# Patient Record
Sex: Female | Born: 1985 | Race: Black or African American | Hispanic: No | Marital: Single | Smoking: Former smoker
Health system: Southern US, Community
[De-identification: ages and names within clinical notes are randomized; demographics above are authoritative.]

## PROBLEM LIST (undated history)

## (undated) ENCOUNTER — Inpatient Hospital Stay (HOSPITAL_COMMUNITY): Payer: Self-pay

## (undated) DIAGNOSIS — D649 Anemia, unspecified: Secondary | ICD-10-CM

## (undated) HISTORY — PX: TONSILLECTOMY: SUR1361

---

## 2012-07-13 ENCOUNTER — Emergency Department (HOSPITAL_COMMUNITY)
Admission: EM | Admit: 2012-07-13 | Discharge: 2012-07-13 | Disposition: A | Discharge status: Home Health | Payer: Medicaid Other | Attending: Emergency Medicine | Admitting: Emergency Medicine

## 2012-07-13 ENCOUNTER — Encounter (HOSPITAL_COMMUNITY): Payer: Self-pay | Admitting: *Deleted

## 2012-07-13 DIAGNOSIS — N390 Urinary tract infection, site not specified: Secondary | ICD-10-CM

## 2012-07-13 DIAGNOSIS — N898 Other specified noninflammatory disorders of vagina: Secondary | ICD-10-CM

## 2012-07-13 DIAGNOSIS — M545 Low back pain, unspecified: Secondary | ICD-10-CM | POA: Insufficient documentation

## 2012-07-13 DIAGNOSIS — Z331 Pregnant state, incidental: Secondary | ICD-10-CM

## 2012-07-13 DIAGNOSIS — R109 Unspecified abdominal pain: Secondary | ICD-10-CM | POA: Insufficient documentation

## 2012-07-13 LAB — URINALYSIS, ROUTINE W REFLEX MICROSCOPIC
Bilirubin Urine: NEGATIVE
Glucose, UA: NEGATIVE mg/dL
Hgb urine dipstick: NEGATIVE
Ketones, ur: NEGATIVE mg/dL
Protein, ur: NEGATIVE mg/dL
Urobilinogen, UA: 0.2 mg/dL (ref 0.0–1.0)

## 2012-07-13 LAB — CBC WITH DIFFERENTIAL/PLATELET
Basophils Absolute: 0 10*3/uL (ref 0.0–0.1)
Basophils Relative: 0 % (ref 0–1)
Eosinophils Absolute: 0 10*3/uL (ref 0.0–0.7)
Eosinophils Relative: 1 % (ref 0–5)
HCT: 31.7 % — ABNORMAL LOW (ref 36.0–46.0)
Lymphocytes Relative: 25 % (ref 12–46)
MCH: 29.1 pg (ref 26.0–34.0)
MCHC: 36.3 g/dL — ABNORMAL HIGH (ref 30.0–36.0)
MCV: 80.3 fL (ref 78.0–100.0)
Monocytes Absolute: 0.4 10*3/uL (ref 0.1–1.0)
Platelets: 210 10*3/uL (ref 150–400)
RDW: 13.7 % (ref 11.5–15.5)

## 2012-07-13 LAB — COMPREHENSIVE METABOLIC PANEL
AST: 16 U/L (ref 0–37)
CO2: 25 mEq/L (ref 19–32)
Calcium: 9.5 mg/dL (ref 8.4–10.5)
Creatinine, Ser: 0.57 mg/dL (ref 0.50–1.10)
GFR calc non Af Amer: >90 mL/min (ref >90)
Sodium: 133 mEq/L — ABNORMAL LOW (ref 135–145)
Total Protein: 6.7 g/dL (ref 6.0–8.3)

## 2012-07-13 LAB — URINE MICROSCOPIC-ADD ON

## 2012-07-13 LAB — POCT PREGNANCY, URINE: Preg Test, Ur: POSITIVE — AB

## 2012-07-13 LAB — WET PREP, GENITAL
Clue Cells Wet Prep HPF POC: NONE SEEN
Yeast Wet Prep HPF POC: NONE SEEN

## 2012-07-13 MED ORDER — CEPHALEXIN 500 MG PO CAPS: 500.0000 mg | ORAL_CAPSULE | Freq: Four times a day (QID) | ORAL | Status: DC

## 2012-07-13 NOTE — ED Notes (Signed)
Pelvic cart set up in room 

## 2012-07-13 NOTE — ED Notes (Signed)
Pt sts LMP was in June 2013

## 2012-07-13 NOTE — ED Provider Notes (Signed)
History     CSN: [id number]  Arrival date & time 07/13/12  6578   First MD Initiated Contact with Patient 07/13/12 1055      Chief Complaint  Patient presents with  . Abdominal Pain  . Vaginal Bleeding    (Consider location/radiation/quality/duration/timing/severity/associated sxs/prior treatment) HPI Comments: 27 year old female who presents with abdominal cramping and lower back cramping with associated vaginal spotting for the last 2 days. She reports an increased vaginal discharge. Her symptoms are intermittent, mild, nothing seems to make it better or worse, not associated with fevers chills nausea or vomiting. She states that she has had one child in the past who is currently approximately 26 years old, she has no other pregnancies, she has irregular periods and does not use birth control other than barrier method with the occasional condom use. She is unsure she is pregnant.  Patient is a 27 y.o. female presenting with abdominal pain and vaginal bleeding. The history is provided by the patient.  Abdominal Pain Associated symptoms: vaginal bleeding   Associated symptoms: no chest pain, no chills, no cough, no diarrhea, no dysuria, no fever, no nausea, no shortness of breath, no sore throat and no vomiting   Vaginal Bleeding Associated symptoms include abdominal pain. Pertinent negatives include no chest pain, no headaches and no shortness of breath.    History reviewed. No pertinent past medical history.  History reviewed. No pertinent past surgical history.  No family history on file.  History  Substance Use Topics  . Smoking status: Never Smoker   . Smokeless tobacco: Not on file  . Alcohol Use: No    OB History   Grav Para Term Preterm Abortions TAB SAB Ect Mult Living                  Review of Systems  Constitutional: Negative for fever and chills.  HENT: Negative for sore throat and neck pain.   Eyes: Negative for visual disturbance.  Respiratory: Negative  for cough and shortness of breath.   Cardiovascular: Negative for chest pain.  Gastrointestinal: Positive for abdominal pain. Negative for nausea, vomiting and diarrhea.  Genitourinary: Positive for vaginal bleeding. Negative for dysuria and frequency.  Musculoskeletal: Negative for back pain.  Skin: Negative for rash.  Neurological: Negative for weakness, numbness and headaches.  Hematological: Negative for adenopathy.  Psychiatric/Behavioral: Negative for behavioral problems.    Allergies  Review of patient's allergies indicates no known allergies.  Home Medications   Current Outpatient Rx  Name  Route  Sig  Dispense  Refill  . cephALEXin (KEFLEX) 500 MG capsule   Oral   Take 1 capsule (500 mg total) by mouth 4 (four) times daily.   28 capsule   0     BP 139/57  Pulse 67  Temp(Src) 98 F (36.7 C) (Oral)  Resp 18  SpO2 100%  Physical Exam  Nursing note and vitals reviewed. Constitutional: She appears well-developed and well-nourished. No distress.  HENT:  Head: Normocephalic and atraumatic.  Mouth/Throat: Oropharynx is clear and moist. No oropharyngeal exudate.  Eyes: Conjunctivae and EOM are normal. Pupils are equal, round, and reactive to light. Right eye exhibits no discharge. Left eye exhibits no discharge. No scleral icterus.  Neck: Normal range of motion. Neck supple. No JVD present. No thyromegaly present.  Cardiovascular: Normal rate, regular rhythm, normal heart sounds and intact distal pulses.  Exam reveals no gallop and no friction rub.   No murmur heard. Pulmonary/Chest: Effort normal and breath sounds normal.  No respiratory distress. She has no wheezes. She has no rales.  Abdominal: Soft. Bowel sounds are normal. She exhibits no distension and no mass. There is tenderness.  The abdomen is soft and minimally tender in the lower abdomen centrally and bilaterally. No upper abdominal tenderness, no peritoneal signs, no focal tenderness at McBurney's point   Genitourinary:  Chaperone present for exam, the patient has some yellow vaginal discharge, no cervical motion tenderness or adnexal tenderness or fullness, no vaginal bleeding, cervical os is closed  Musculoskeletal: Normal range of motion. She exhibits no edema and no tenderness.  Lymphadenopathy:    She has no cervical adenopathy.  Neurological: She is alert. Coordination normal.  Skin: Skin is warm and dry. No rash noted. No erythema.  Psychiatric: She has a normal mood and affect. Her behavior is normal.    ED Course  Procedures (including critical care time)  Labs Reviewed  WET PREP, GENITAL - Abnormal; Notable for the following:    WBC, Wet Prep HPF POC MODERATE (*)    All other components within normal limits  URINALYSIS, ROUTINE W REFLEX MICROSCOPIC - Abnormal; Notable for the following:    APPearance CLOUDY (*)    Leukocytes, UA LARGE (*)    All other components within normal limits  CBC WITH DIFFERENTIAL - Abnormal; Notable for the following:    Hemoglobin 11.5 (*)    HCT 31.7 (*)    MCHC 36.3 (*)    All other components within normal limits  COMPREHENSIVE METABOLIC PANEL - Abnormal; Notable for the following:    Sodium 133 (*)    Albumin 3.3 (*)    All other components within normal limits  URINE MICROSCOPIC-ADD ON - Abnormal; Notable for the following:    Squamous Epithelial / LPF MANY (*)    Bacteria, UA FEW (*)    All other components within normal limits  POCT PREGNANCY, URINE - Abnormal; Notable for the following:    Preg Test, Ur POSITIVE (*)    All other components within normal limits  URINE CULTURE  GC/CHLAMYDIA PROBE AMP  LIPASE, BLOOD  ABO/RH   No results found.   1. Pregnant state, incidental   2. Vaginal discharge   3. UTI (lower urinary tract infection)       MDM   I have performed a bedside ultrasound showing an intrauterine pregnancy, fetus with a heart rate of 150 beats per minute, the patient is approaching or likely into the early  second trimester. She is not have any significant abdominal pain or hypotension to suggest that she has an ectopic pregnancy. Will evaluate her for infection such as gonorrhea Chlamydia Trichomonas and yeast, urinalysis pending, urine pregnancy positive.  Pt stable for d/c.  O+, Wet prep neg for infecticon - no ttp with pelvic exam, pt informed that testing will be back within 48 hours.  Na slightly low, blood counts normal, will treat for UTI, culture urine and have f/u with OBGYN      Vida Roller, MD 07/14/12 [phone]

## 2012-07-13 NOTE — ED Notes (Signed)
Pt reports abdominal cramping and vaginal bleeding x 2 days. Reports nausea, no vomiting.

## 2012-07-13 NOTE — ED Notes (Signed)
Pt waiting for test results, talking on her cellphone

## 2012-07-14 LAB — GC/CHLAMYDIA PROBE AMP
CT Probe RNA: NEGATIVE
GC Probe RNA: NEGATIVE

## 2012-07-15 LAB — URINE CULTURE: Colony Count: >=100000

## 2012-08-22 ENCOUNTER — Encounter: Payer: Self-pay | Admitting: Obstetrics

## 2012-08-24 ENCOUNTER — Emergency Department (HOSPITAL_COMMUNITY)
Admission: EM | Admit: 2012-08-24 | Discharge: 2012-08-24 | Disposition: A | Discharge status: Home / Self Care | Payer: Medicaid Other | Attending: Emergency Medicine | Admitting: Emergency Medicine

## 2012-08-24 ENCOUNTER — Emergency Department (HOSPITAL_COMMUNITY): Payer: Medicaid Other

## 2012-08-24 ENCOUNTER — Encounter (HOSPITAL_COMMUNITY): Payer: Self-pay | Admitting: Emergency Medicine

## 2012-08-24 DIAGNOSIS — O9989 Other specified diseases and conditions complicating pregnancy, childbirth and the puerperium: Secondary | ICD-10-CM | POA: Insufficient documentation

## 2012-08-24 DIAGNOSIS — Z3490 Encounter for supervision of normal pregnancy, unspecified, unspecified trimester: Secondary | ICD-10-CM

## 2012-08-24 DIAGNOSIS — R1031 Right lower quadrant pain: Secondary | ICD-10-CM | POA: Insufficient documentation

## 2012-08-24 DIAGNOSIS — M549 Dorsalgia, unspecified: Secondary | ICD-10-CM | POA: Insufficient documentation

## 2012-08-24 LAB — COMPREHENSIVE METABOLIC PANEL
Alkaline Phosphatase: 55 U/L (ref 39–117)
BUN: 7 mg/dL (ref 6–23)
Calcium: 9.2 mg/dL (ref 8.4–10.5)
Creatinine, Ser: 0.55 mg/dL (ref 0.50–1.10)
GFR calc Af Amer: >90 mL/min (ref >90)
Glucose, Bld: 79 mg/dL (ref 70–99)
Total Protein: 6.8 g/dL (ref 6.0–8.3)

## 2012-08-24 LAB — POCT PREGNANCY, URINE: Preg Test, Ur: POSITIVE — AB

## 2012-08-24 LAB — URINALYSIS, MICROSCOPIC ONLY
Ketones, ur: NEGATIVE mg/dL
Nitrite: NEGATIVE
Specific Gravity, Urine: 1.023 (ref 1.005–1.030)
pH: 6 (ref 5.0–8.0)

## 2012-08-24 LAB — CBC WITH DIFFERENTIAL/PLATELET
Eosinophils Absolute: 0.1 10*3/uL (ref 0.0–0.7)
Eosinophils Relative: 1 % (ref 0–5)
Hemoglobin: 11.3 g/dL — ABNORMAL LOW (ref 12.0–15.0)
Lymphs Abs: 2.5 10*3/uL (ref 0.7–4.0)
MCH: 29.5 pg (ref 26.0–34.0)
MCV: 82.5 fL (ref 78.0–100.0)
Monocytes Relative: 6 % (ref 3–12)
RBC: 3.83 MIL/uL — ABNORMAL LOW (ref 3.87–5.11)

## 2012-08-24 MED ORDER — PRENATAL COMPLETE 14-0.4 MG PO TABS: 1.0000 | ORAL_TABLET | Freq: Every day | ORAL | Status: DC

## 2012-08-24 NOTE — ED Notes (Signed)
Pt dc to home. Pt states understanding to dc instructions. Pt ambulatory to exit without difficulty. 

## 2012-08-24 NOTE — ED Provider Notes (Signed)
History     CSN: [id number]  Arrival date & time 08/24/12  1746   First MD Initiated Contact with Patient 08/24/12 1940      Chief Complaint  Patient presents with  . Abdominal Pain     Patient is a 27 y.o. female presenting with abdominal pain. The history is provided by the patient.  Abdominal Pain Pain location:  RLQ Pain quality: cramping   Pain radiates to:  Does not radiate Pain severity:  Mild Onset quality:  Gradual Duration:  2 days Timing:  Intermittent Progression:  Worsening Chronicity:  New Relieved by:  Nothing Worsened by:  Nothing tried Associated symptoms: no chills, no dysuria, no fever, no hematuria, no nausea, no vaginal bleeding, no vaginal discharge and no vomiting   Risk factors: pregnancy     PMH - none  History reviewed. No pertinent past surgical history.  History reviewed. No pertinent family history.  History  Substance Use Topics  . Smoking status: Never Smoker   . Smokeless tobacco: Not on file  . Alcohol Use: No    OB History   Grav Para Term Preterm Abortions TAB SAB Ect Mult Living                  Review of Systems  Constitutional: Negative for fever and chills.  Gastrointestinal: Positive for abdominal pain. Negative for nausea and vomiting.  Genitourinary: Negative for dysuria, hematuria, vaginal bleeding and vaginal discharge.  Musculoskeletal: Positive for back pain.    Allergies  Review of patient's allergies indicates no known allergies.  Home Medications   Current Outpatient Rx  Name  Route  Sig  Dispense  Refill  . acetaminophen (TYLENOL) 325 MG tablet   Oral   Take 650 mg by mouth every 6 (six) hours as needed for pain.           BP 115/66  Pulse 68  Temp(Src) 98.4 F (36.9 C) (Oral)  Resp 16  SpO2 99%  Physical Exam CONSTITUTIONAL: Well developed/well nourished HEAD: Normocephalic/atraumatic EYES: EOMI/PERRL ENMT: Mucous membranes moist NECK: supple no meningeal signs SPINE:entire spine  nontender CV: S1/S2 noted, no murmurs/rubs/gallops noted LUNGS: Lungs are clear to auscultation bilaterally, no apparent distress ABDOMEN: soft, nontender, no rebound or guarding GU:no cva tenderness NEURO: Pt is awake/alert, moves all extremitiesx4 EXTREMITIES: pulses normal, full ROM SKIN: warm, color normal PSYCH: no abnormalities of mood noted  ED Course  Procedures   Labs Reviewed  CBC WITH DIFFERENTIAL - Abnormal; Notable for the following:    WBC 11.5 (*)    RBC 3.83 (*)    Hemoglobin 11.3 (*)    HCT 31.6 (*)    Neutro Abs 8.3 (*)    All other components within normal limits  COMPREHENSIVE METABOLIC PANEL - Abnormal; Notable for the following:    Sodium 134 (*)    Albumin 3.2 (*)    Total Bilirubin 0.2 (*)    All other components within normal limits  URINALYSIS, MICROSCOPIC ONLY - Abnormal; Notable for the following:    APPearance CLOUDY (*)    Leukocytes, UA MODERATE (*)    Bacteria, UA FEW (*)    Squamous Epithelial / LPF MANY (*)    All other components within normal limits  HCG, QUANTITATIVE, PREGNANCY - Abnormal; Notable for the following:    hCG, Beta Chain, Quant, S 3015 (*)    All other components within normal limits  POCT PREGNANCY, URINE - Abnormal; Notable for the following:    Preg Test,  Ur POSITIVE (*)    All other components within normal limits  URINE CULTURE   8:44 PM Pt presents for abdominal cramping for 2 days.  She also reports back cramps She is in no distress and her abdominal exam is benign She refuses pelvic exam She was seen in 07/13/12 for abd pain and found to be pregnant.  Bedside US showed likely first trimester pregnancy Pt is unsure of her exact dates as her menstrual cycle has been irregular.  She has not seen OB as of yet She is clinically stable but I do feel formal pelvic ultrasound would help further delineate age of pregnancy She reports her OB appt is next month  9:58 PM Pt well appearing, no distress, watching  TV Discussed [place] results and need for close followup She denies any trauma, or any other concerns    MDM  Nursing notes including past medical history and social history reviewed and considered in documentation Labs/vital reviewed and considered Previous records reviewed and considered - recent ED visit reviewed         Joya Gaskins, MD 08/24/12 2159

## 2012-08-24 NOTE — ED Notes (Signed)
Pt to ultrasound

## 2012-08-24 NOTE — ED Notes (Addendum)
Pt c/o abd cramping and back pain; pt seen here in March and found out pregnant; pt unsure of LMP; pt denies bleeding and thinks she is 3-4 months; pt reports no prenatal care and G2 P1

## 2012-08-26 LAB — URINE CULTURE: Colony Count: >=100000

## 2012-09-18 ENCOUNTER — Ambulatory Visit (INDEPENDENT_AMBULATORY_CARE_PROVIDER_SITE_OTHER): Payer: Medicaid Other | Admitting: Obstetrics & Gynecology

## 2012-09-18 ENCOUNTER — Encounter: Payer: Self-pay | Admitting: Obstetrics & Gynecology

## 2012-09-18 VITALS — BP 119/80 | Temp 98.4°F | Wt 222.0 lb

## 2012-09-18 DIAGNOSIS — A599 Trichomoniasis, unspecified: Secondary | ICD-10-CM

## 2012-09-18 DIAGNOSIS — Z98891 History of uterine scar from previous surgery: Secondary | ICD-10-CM

## 2012-09-18 DIAGNOSIS — Z9889 Other specified postprocedural states: Secondary | ICD-10-CM

## 2012-09-18 DIAGNOSIS — Z3480 Encounter for supervision of other normal pregnancy, unspecified trimester: Secondary | ICD-10-CM

## 2012-09-18 DIAGNOSIS — Z3482 Encounter for supervision of other normal pregnancy, second trimester: Secondary | ICD-10-CM

## 2012-09-18 LAB — POCT WET PREP (WET MOUNT)

## 2012-09-18 MED ORDER — METRONIDAZOLE 500 MG PO TABS: 500.0000 mg | ORAL_TABLET | Freq: Two times a day (BID) | ORAL | Status: DC

## 2012-09-18 NOTE — Patient Instructions (Addendum)
Trichomoniasis Trichomoniasis is an infection, caused by the Trichomonas organism, that affects both women and men. In women, the outer female genitalia and the vagina are affected. In men, the penis is mainly affected, but the prostate and other reproductive organs can also be involved. Trichomoniasis is a sexually transmitted disease (STD) and is most often passed to another person through sexual contact. The majority of people who get trichomoniasis do so from a sexual encounter and are also at risk for other STDs. CAUSES   Sexual intercourse with an infected partner.  It can be present in swimming pools or hot tubs. SYMPTOMS   Abnormal gray-green frothy vaginal discharge in women.  Vaginal itching and irritation in women.  Itching and irritation of the area outside the vagina in women.  Penile discharge with or without pain in males.  Inflammation of the urethra (urethritis), causing painful urination.  Bleeding after sexual intercourse. RELATED COMPLICATIONS  Pelvic inflammatory disease.  Infection of the uterus (endometritis).  Infertility.  Tubal (ectopic) pregnancy.  It can be associated with other STDs, including gonorrhea and chlamydia, hepatitis B, and HIV. COMPLICATIONS DURING PREGNANCY  Early (premature) delivery.  Premature rupture of the membranes (PROM).  Low birth weight. DIAGNOSIS   Visualization of Trichomonas under the microscope from the vagina discharge.  Ph of the vagina greater than 4.5, tested with a test tape.  Trich Rapid Test.  Culture of the organism, but this is not usually needed.  It may be found on a Pap test.  Having a "strawberry cervix,"which means the cervix looks very red like a strawberry. TREATMENT   You may be given medication to fight the infection. Inform your caregiver if you could be or are pregnant. Some medications used to treat the infection should not be taken during pregnancy.  Over-the-counter medications or  creams to decrease itching or irritation may be recommended.  Your sexual partner will need to be treated if infected. HOME CARE INSTRUCTIONS   Take all medication prescribed by your caregiver.  Take over-the-counter medication for itching or irritation as directed by your caregiver.  Do not have sexual intercourse while you have the infection.  Do not douche or wear tampons.  Discuss your infection with your partner, as your partner may have acquired the infection from you. Or, your partner may have been the person who transmitted the infection to you.  Have your sex partner examined and treated if necessary.  Practice safe, informed, and protected sex.  See your caregiver for other STD testing. SEEK MEDICAL CARE IF:   You still have symptoms after you finish the medication.  You have an oral temperature above 102 F (38.9 C).  You develop belly (abdominal) pain.  You have pain when you urinate.  You have bleeding after sexual intercourse.  You develop a rash.  The medication makes you sick or makes you throw up (vomit). Document Released: 10/20/2000 Document Revised: 07/19/2011 Document Reviewed: 11/15/2008 [facility] Patient Information 2013 [place], [place]. Glucose Tolerance Test This is a test to see how your body processes carbohydrates. This test is often done to check patients for diabetes or the possibility of developing it. PREPARATION FOR TEST You should have nothing to eat or drink 12 hours before the test. You will be given a form of sugar (glucose) and then blood samples will be drawn from your vein to determine the level of sugar in your blood. Alternatively, blood may be drawn from your finger for testing. You should not smoke or exercise during  the test. NORMAL FINDINGS  Fasting: 70-115 mg/dL  30 minutes: less than 200 mg/dL  1 hour: less than 161 mg/dL  2 hours: less than 096 mg/dL  3 hours: 04-540 mg/dL  4 hours: 98-119 mg/dL Ranges for normal  findings may vary among different laboratories and hospitals. You should always check with your doctor after having lab work or other tests done to discuss the meaning of your test results and whether your values are considered within normal limits. MEANING OF TEST Your caregiver will go over the test results with you and discuss the importance and meaning of your results, as well as treatment options and the need for additional tests. OBTAINING THE TEST RESULTS It is your responsibility to obtain your test results. Ask the lab or department performing the test when and how you will get your results. Document Released: 05/19/2004 Document Revised: 07/19/2011 Document Reviewed: 04/06/2008 [facility] Patient Information 2013 [place], [place].

## 2012-09-18 NOTE — Addendum Note (Signed)
Addended by: George Hugh on: 09/18/2012 05:06 PM   Modules accepted: Orders

## 2012-09-18 NOTE — Addendum Note (Signed)
Addended by: George Hugh on: 09/18/2012 05:00 PM   Modules accepted: Orders

## 2012-09-18 NOTE — Progress Notes (Signed)
P 71 Patient reports she has some pressure in the rectal area. Pt also states she has some itching with her discharge. Subjective:    Robin Cochran is being seen today for her first obstetrical visit.  This is not a planned pregnancy. She is at [redacted]w[redacted]d gestation. Her obstetrical history is significant for late prenatal care. Relationship with FOB: significant other, living together. Patient does not intend to breast feed. Pregnancy history fully reviewed.  Menstrual History: OB History   Grav Para Term Preterm Abortions TAB SAB Ect Mult Living   2 1 1       1        No LMP recorded. Patient is pregnant.    The following portions of the patient's history were reviewed and updated as appropriate: allergies, current medications, past family history, past medical history, past social history, past surgical history and problem list.  Review of Systems Pertinent items are noted in HPI.    Objective:   General Appearance:    Alert, cooperative, no distress, appears stated age  Head:    Normocephalic, without obvious abnormality, atraumatic  Eyes:    PERRL, conjunctiva/corneas clear, EOM's intact, fundi    benign, both eyes  Ears:    Normal TM's and external ear canals, both ears  Nose:   Nares normal, septum midline, mucosa normal, no drainage    or sinus tenderness  Throat:   Lips, mucosa, and tongue normal; teeth and gums normal  Neck:   Supple, symmetrical, trachea midline, no adenopathy;    thyroid:  no enlargement/tenderness/nodules; no carotid   bruit or JVD  Back:     Symmetric, no curvature, ROM normal, no CVA tenderness  Lungs:     Clear to auscultation bilaterally, respirations unlabored  Chest Wall:    No tenderness or deformity   Heart:    Regular rate and rhythm, S1 and S2 normal, no murmur, rub   or gallop  Breast Exam:    No tenderness, masses, or nipple abnormality  Abdomen:     Soft, non-tender, bowel sounds active all four quadrants,    no masses, no organomegaly   Genitalia:    Normal female without lesion; white discharge   Extremities:   Extremities normal, atraumatic, no cyanosis or edema  Pulses:   2+ and symmetric all extremities  Skin:   Skin color, texture, turgor normal, no rashes or lesions  Lymph nodes:   Cervical, supraclavicular, and axillary nodes normal  Neurologic:   CNII-XII intact, normal strength, sensation and reflexes    throughout     Assessment:    Pregnancy at [redacted]w[redacted]d weeks    Plan:    Initial labs drawn. Prenatal vitamins. Problem list reviewed and updated.  Role of ultrasound in pregnancy discussed; fetal survey: requested. Amniocentesis discussed: not indicated. Follow up in 4 weeks. 50% of 20 min visit spent on counseling and coordination of care.

## 2012-09-19 ENCOUNTER — Other Ambulatory Visit: Payer: Self-pay | Admitting: Obstetrics & Gynecology

## 2012-09-19 DIAGNOSIS — Z1389 Encounter for screening for other disorder: Secondary | ICD-10-CM

## 2012-09-19 LAB — PAP IG W/ RFLX HPV ASCU

## 2012-09-19 LAB — HIV ANTIBODY (ROUTINE TESTING W REFLEX): HIV: NONREACTIVE

## 2012-09-19 LAB — WET PREP BY MOLECULAR PROBE: Trichomonas vaginosis: NEGATIVE

## 2012-09-20 ENCOUNTER — Ambulatory Visit (INDEPENDENT_AMBULATORY_CARE_PROVIDER_SITE_OTHER): Payer: Medicaid Other

## 2012-09-20 ENCOUNTER — Encounter: Payer: Self-pay | Admitting: Obstetrics & Gynecology

## 2012-09-20 DIAGNOSIS — Z369 Encounter for antenatal screening, unspecified: Secondary | ICD-10-CM

## 2012-09-20 DIAGNOSIS — Z1389 Encounter for screening for other disorder: Secondary | ICD-10-CM

## 2012-09-20 LAB — OBSTETRIC PANEL
Antibody Screen: NEGATIVE
Basophils Absolute: 0 10*3/uL (ref 0.0–0.1)
Basophils Relative: 0 % (ref 0–1)
Eosinophils Absolute: 0.1 10*3/uL (ref 0.0–0.7)
Eosinophils Relative: 0 % (ref 0–5)
MCH: 28.9 pg (ref 26.0–34.0)
MCHC: 34 g/dL (ref 30.0–36.0)
MCV: 85.1 fL (ref 78.0–100.0)
Platelets: 217 10*3/uL (ref 150–400)
RDW: 15.3 % (ref 11.5–15.5)
WBC: 11.2 10*3/uL — ABNORMAL HIGH (ref 4.0–10.5)

## 2012-09-20 LAB — US OB DETAIL + 14 WK

## 2012-09-20 LAB — HEMOGLOBINOPATHY EVALUATION
Hgb A: 97.3 % (ref 96.8–97.8)
Hgb F Quant: 0 % (ref 0.0–2.0)
Hgb S Quant: 0 %

## 2012-09-21 ENCOUNTER — Encounter: Payer: Self-pay | Admitting: Obstetrics & Gynecology

## 2012-09-22 ENCOUNTER — Encounter: Payer: Self-pay | Admitting: Obstetrics & Gynecology

## 2012-09-22 DIAGNOSIS — R8271 Bacteriuria: Secondary | ICD-10-CM | POA: Insufficient documentation

## 2012-09-22 DIAGNOSIS — E559 Vitamin D deficiency, unspecified: Secondary | ICD-10-CM | POA: Insufficient documentation

## 2012-09-22 DIAGNOSIS — O9989 Other specified diseases and conditions complicating pregnancy, childbirth and the puerperium: Secondary | ICD-10-CM

## 2012-09-22 DIAGNOSIS — Z22330 Carrier of Group B streptococcus: Secondary | ICD-10-CM | POA: Insufficient documentation

## 2012-10-10 ENCOUNTER — Other Ambulatory Visit: Payer: Self-pay | Admitting: *Deleted

## 2012-10-10 ENCOUNTER — Encounter: Payer: Self-pay | Admitting: *Deleted

## 2012-10-10 DIAGNOSIS — N390 Urinary tract infection, site not specified: Secondary | ICD-10-CM

## 2012-10-10 DIAGNOSIS — E559 Vitamin D deficiency, unspecified: Secondary | ICD-10-CM

## 2012-10-10 MED ORDER — OB COMPLETE PETITE 35-5-1-200 MG PO CAPS: 1.0000 | ORAL_CAPSULE | Freq: Every day | ORAL | Status: DC

## 2012-10-10 MED ORDER — SULFAMETHOXAZOLE-TMP DS 800-160 MG PO TABS: 1.0000 | ORAL_TABLET | Freq: Two times a day (BID) | ORAL | Status: DC

## 2012-10-18 ENCOUNTER — Encounter: Payer: Medicaid Other | Admitting: Obstetrics & Gynecology

## 2012-10-23 ENCOUNTER — Encounter: Payer: Medicaid Other | Admitting: Obstetrics & Gynecology

## 2012-11-23 ENCOUNTER — Encounter: Payer: Medicaid Other | Admitting: Obstetrics & Gynecology

## 2012-12-25 ENCOUNTER — Encounter: Payer: Medicaid Other | Admitting: Obstetrics

## 2013-07-24 ENCOUNTER — Encounter (HOSPITAL_COMMUNITY): Payer: Self-pay | Admitting: *Deleted

## 2014-01-09 ENCOUNTER — Ambulatory Visit: Payer: Medicaid Other | Admitting: Internal Medicine

## 2014-03-11 ENCOUNTER — Encounter (HOSPITAL_COMMUNITY): Payer: Self-pay | Admitting: *Deleted

## 2014-03-11 ENCOUNTER — Ambulatory Visit: Payer: Medicaid Other | Admitting: Obstetrics & Gynecology

## 2014-05-06 ENCOUNTER — Encounter: Payer: Self-pay | Admitting: *Deleted

## 2014-05-07 ENCOUNTER — Encounter: Payer: Self-pay | Admitting: Obstetrics & Gynecology

## 2014-09-17 ENCOUNTER — Emergency Department (HOSPITAL_COMMUNITY)
Admission: EM | Admit: 2014-09-17 | Discharge: 2014-09-17 | Disposition: A | Discharge status: Home / Self Care | Payer: Medicaid Other | Attending: Emergency Medicine | Admitting: Emergency Medicine

## 2014-09-17 ENCOUNTER — Encounter (HOSPITAL_COMMUNITY): Payer: Self-pay | Admitting: Physical Medicine and Rehabilitation

## 2014-09-17 DIAGNOSIS — K0889 Other specified disorders of teeth and supporting structures: Secondary | ICD-10-CM

## 2014-09-17 DIAGNOSIS — Z87891 Personal history of nicotine dependence: Secondary | ICD-10-CM | POA: Diagnosis not present

## 2014-09-17 DIAGNOSIS — K088 Other specified disorders of teeth and supporting structures: Secondary | ICD-10-CM | POA: Diagnosis present

## 2014-09-17 DIAGNOSIS — K029 Dental caries, unspecified: Secondary | ICD-10-CM | POA: Insufficient documentation

## 2014-09-17 DIAGNOSIS — K0381 Cracked tooth: Secondary | ICD-10-CM | POA: Insufficient documentation

## 2014-09-17 DIAGNOSIS — Z792 Long term (current) use of antibiotics: Secondary | ICD-10-CM | POA: Diagnosis not present

## 2014-09-17 MED ORDER — NAPROXEN 500 MG PO TABS: 500.0000 mg | ORAL_TABLET | Freq: Two times a day (BID) | ORAL | Status: DC

## 2014-09-17 MED ORDER — NAPROXEN 250 MG PO TABS
500.0000 mg | ORAL_TABLET | Freq: Once | ORAL | Status: AC
  Administered 2014-09-17: 500 mg via ORAL
  Filled 2014-09-17: qty 2

## 2014-09-17 MED ORDER — PENICILLIN V POTASSIUM 250 MG PO TABS
500.0000 mg | ORAL_TABLET | Freq: Once | ORAL | Status: AC
  Administered 2014-09-17: 500 mg via ORAL
  Filled 2014-09-17: qty 2

## 2014-09-17 MED ORDER — PENICILLIN V POTASSIUM 500 MG PO TABS: 500.0000 mg | ORAL_TABLET | Freq: Four times a day (QID) | ORAL | Status: DC

## 2014-09-17 NOTE — ED Provider Notes (Signed)
CSN: [id number]     Arrival date & time 09/17/14  1352 History  This chart was scribed for Emilia BeckKaitlyn Dary Dilauro, PA-C working with Bethann BerkshireJoseph Zammit, MD by Evon Slackerrance Branch, ED Scribe. This patient was seen in room TR05C/TR05C and the patient's care was started at 2:16 PM.      Chief Complaint  Patient presents with  . Dental Pain   Patient is a 29 y.o. female presenting with tooth pain. The history is provided by the patient. No language interpreter was used.  Dental Pain Associated symptoms: no fever    HPI Comments: Lucrezia StarchKimberly Ferrington is a 29 y.o. female who presents to the Emergency Department complaining of right sided upper dental pain onset 2 months prior. Pt rates the severity of her pan 8/10. Pt states the pain recently worsened 2 days ago when she chipped her tooth eating pepperoni. Pt doesn't report any medications PTA. Pt denies fever, trouble swallowing or other related symptoms.   History reviewed. No pertinent past medical history. Past Surgical History  Procedure Laterality Date  . Cesarean section    . Tonsillectomy     Family History  Problem Relation Age of Onset  . Arthritis Mother   . Hypertension Father   . Hypertension Paternal Grandmother    History  Substance Use Topics  . Smoking status: Former Smoker    Quit date: 05/11/2011  . Smokeless tobacco: Not on file  . Alcohol Use: Yes   OB History    Gravida Para Term Preterm AB TAB SAB Ectopic Multiple Living   2 1 1       1       Review of Systems  Constitutional: Negative for fever.  HENT: Positive for dental problem. Negative for trouble swallowing.   All other systems reviewed and are negative.    Allergies  Review of patient's allergies indicates no known allergies.  Home Medications   Prior to Admission medications   Medication Sig Start Date End Date Taking? Authorizing Provider  acetaminophen (TYLENOL) 325 MG tablet Take 650 mg by mouth every 6 (six) hours as needed for pain.    Historical  Provider, MD  metroNIDAZOLE (FLAGYL) 500 MG tablet Take 1 tablet (500 mg total) by mouth 2 (two) times daily. 09/18/12   Antionette CharLisa Jackson-Moore, MD  Prenat-FeCbn-FeAspGl-FA-Omega (OB COMPLETE PETITE) 35-5-1-200 MG CAPS Take 1 capsule by mouth daily. 10/10/12   Antionette CharLisa Jackson-Moore, MD  sulfamethoxazole-trimethoprim (BACTRIM DS) 800-160 MG per tablet Take 1 tablet by mouth 2 (two) times daily. 10/10/12   Antionette CharLisa Jackson-Moore, MD   BP 129/82 mmHg  Pulse 63  Temp(Src) 98 F (36.7 C) (Oral)  Resp 20  SpO2 99%   Physical Exam  Constitutional: She is oriented to person, place, and time. She appears well-developed and well-nourished. No distress.  HENT:  Head: Normocephalic and atraumatic.  Mouth/Throat: No dental abscesses.  right upper pre molar cracked and decayed and tender to percussion. No dental abscess noted.   Eyes: Conjunctivae and EOM are normal.  Neck: Neck supple. No tracheal deviation present.  Cardiovascular: Normal rate.   Pulmonary/Chest: Effort normal. No respiratory distress.  Abdominal: Soft. She exhibits no distension. There is no tenderness. There is no rebound.  Musculoskeletal: Normal range of motion.  Neurological: She is alert and oriented to person, place, and time.  Skin: Skin is warm and dry.  Psychiatric: She has a normal mood and affect. Her behavior is normal.  Nursing note and vitals reviewed.   ED Course  Procedures (including critical care  time) DIAGNOSTIC STUDIES: Oxygen Saturation is 99% on RA, normal by my interpretation.    COORDINATION OF CARE: 3:17 PM-Discussed treatment plan with pt at bedside and pt agreed to plan.     Labs Review Labs Reviewed - No data to display  Imaging Review No results found.   EKG Interpretation None      MDM   Final diagnoses:  Pain, dental    Patient will have Veetid and naprosyn for dental pain. No signs of ludwigs angina. Vitals stable and patient afebrile. Patient given dental follow up.   I personally  performed the services described in this documentation, which was scribed in my presence. The recorded information has been reviewed and is accurate.      Emilia BeckKaitlyn Kela Baccari, PA-C 09/18/14 1108  Bethann BerkshireJoseph Zammit, MD 09/18/14 1452

## 2014-09-17 NOTE — ED Notes (Signed)
Pt presents to department for evaluation of R sided upper dental pain. Ongoing x1 week. 8/10 pain upon arrival to ED.

## 2014-09-17 NOTE — Discharge Instructions (Signed)
Take Veetid as directed until gone. Take Naprosyn as needed for pain. Follow up with the dentist. Return to the ED with worsening or concerning symptoms.

## 2014-11-20 ENCOUNTER — Emergency Department (HOSPITAL_COMMUNITY)
Admission: EM | Admit: 2014-11-20 | Discharge: 2014-11-20 | Disposition: A | Discharge status: Home / Self Care | Payer: Medicaid Other | Attending: Emergency Medicine | Admitting: Emergency Medicine

## 2014-11-20 ENCOUNTER — Encounter (HOSPITAL_COMMUNITY): Payer: Self-pay | Admitting: *Deleted

## 2014-11-20 DIAGNOSIS — Z87891 Personal history of nicotine dependence: Secondary | ICD-10-CM | POA: Insufficient documentation

## 2014-11-20 DIAGNOSIS — Z79899 Other long term (current) drug therapy: Secondary | ICD-10-CM | POA: Insufficient documentation

## 2014-11-20 DIAGNOSIS — K029 Dental caries, unspecified: Secondary | ICD-10-CM | POA: Insufficient documentation

## 2014-11-20 DIAGNOSIS — K0381 Cracked tooth: Secondary | ICD-10-CM | POA: Insufficient documentation

## 2014-11-20 DIAGNOSIS — S025XXA Fracture of tooth (traumatic), initial encounter for closed fracture: Secondary | ICD-10-CM

## 2014-11-20 MED ORDER — BUPIVACAINE-EPINEPHRINE (PF) 0.5% -1:200000 IJ SOLN
1.8000 mL | Freq: Once | INTRAMUSCULAR | Status: AC
  Administered 2014-11-20: 1.8 mL
  Filled 2014-11-20: qty 1.8

## 2014-11-20 MED ORDER — PENICILLIN V POTASSIUM 500 MG PO TABS: 500.0000 mg | ORAL_TABLET | Freq: Three times a day (TID) | ORAL | Status: DC

## 2014-11-20 MED ORDER — NAPROXEN 500 MG PO TABS: 500.0000 mg | ORAL_TABLET | Freq: Two times a day (BID) | ORAL | Status: DC

## 2014-11-20 NOTE — ED Provider Notes (Signed)
CSN: [id number]     Arrival date & time 11/20/14  1947 History   This chart was scribed for non-physician practitioner, Dierdre Forth PA-C working with Bethann Berkshire, MD by Arlan Organ, ED Scribe. This patient was seen in room TR09C/TR09C and the patient's care was started at 8:29 PM.   Chief Complaint  Patient presents with  . Dental Pain   The history is provided by the patient and medical records. No language interpreter was used.    HPI Comments: Robin Cochran is a 29 y.o. female without any pertinent past medical history who presents to the Emergency Department complaining of constant, ongoing R upper dental pain x 3 weeks. Pt states she noted discomfort after biting down on something hard. OTC medications attempted at home without any relief. No recent fever or chills. Robin Cochran was seen previously in the Emergency Department for same and sent home with prescriptions for Penicillin and Naproxen. However, pt did not complete her course of Penicillin and denies any improvement with Naproxen. Robin Cochran denies following up with a dentist and states she can not get in for several weeks. No known allergies to medications.  History reviewed. No pertinent past medical history. Past Surgical History  Procedure Laterality Date  . Cesarean section    . Tonsillectomy     Family History  Problem Relation Age of Onset  . Arthritis Mother   . Hypertension Father   . Hypertension Paternal Grandmother    History  Substance Use Topics  . Smoking status: Former Smoker    Quit date: 05/11/2011  . Smokeless tobacco: Not on file  . Alcohol Use: Yes   OB History    Gravida Para Term Preterm AB TAB SAB Ectopic Multiple Living   2 1 1       1      Review of Systems  Constitutional: Negative for fever, chills and appetite change.  HENT: Positive for dental problem. Negative for drooling, ear pain, facial swelling, nosebleeds, postnasal drip, rhinorrhea and trouble swallowing.   Eyes:  Negative for pain and redness.  Respiratory: Negative for cough and wheezing.   Cardiovascular: Negative for chest pain.  Gastrointestinal: Negative for nausea, vomiting and abdominal pain.  Musculoskeletal: Negative for neck pain and neck stiffness.  Skin: Negative for color change and rash.  Neurological: Negative for weakness, light-headedness and headaches.  All other systems reviewed and are negative.     Allergies  Review of patient's allergies indicates no known allergies.  Home Medications   Prior to Admission medications   Medication Sig Start Date End Date Taking? Authorizing Provider  acetaminophen (TYLENOL) 325 MG tablet Take 650 mg by mouth every 6 (six) hours as needed for pain.    Historical Provider, MD  metroNIDAZOLE (FLAGYL) 500 MG tablet Take 1 tablet (500 mg total) by mouth 2 (two) times daily. 09/18/12   Antionette Char, MD  naproxen (NAPROSYN) 500 MG tablet Take 1 tablet (500 mg total) by mouth 2 (two) times daily with a meal. 11/20/14   Seraiah Nowack, PA-C  penicillin v potassium (VEETID) 500 MG tablet Take 1 tablet (500 mg total) by mouth 3 (three) times daily. 11/20/14   Azlee Monforte, PA-C  Prenat-FeCbn-FeAspGl-FA-Omega (OB COMPLETE PETITE) 35-5-1-200 MG CAPS Take 1 capsule by mouth daily. 10/10/12   Antionette Char, MD  sulfamethoxazole-trimethoprim (BACTRIM DS) 800-160 MG per tablet Take 1 tablet by mouth 2 (two) times daily. 10/10/12   Antionette Char, MD   Triage Vitals: BP 139/82 mmHg  Pulse 70  Temp(Src) 98.5 F (36.9 C) (Oral)  Resp 18  SpO2 100%  LMP 11/18/2014 (Exact Date)   Physical Exam  Constitutional: She appears well-developed and well-nourished.  HENT:  Head: Normocephalic.  Right Ear: Tympanic membrane, external ear and ear canal normal.  Left Ear: Tympanic membrane, external ear and ear canal normal.  Nose: Nose normal. Right sinus exhibits no maxillary sinus tenderness and no frontal sinus tenderness. Left sinus  exhibits no maxillary sinus tenderness and no frontal sinus tenderness.  Mouth/Throat: Uvula is midline, oropharynx is clear and moist and mucous membranes are normal. No oral lesions. Abnormal dentition. Dental caries present. No uvula swelling or lacerations. No oropharyngeal exudate, posterior oropharyngeal edema, posterior oropharyngeal erythema or tonsillar abscesses.  No gingival swelling, fluctuance or induration No gross abscess Tooth number 5 is fractured  Tenderness to palpation noted   Eyes: Conjunctivae are normal. Pupils are equal, round, and reactive to light. Right eye exhibits no discharge. Left eye exhibits no discharge.  Neck: Normal range of motion. Neck supple.  No stridor Handling secretions without difficulty No nuchal rigidity No cervical lymphadenopathy   Cardiovascular: Normal rate, regular rhythm and normal heart sounds.   Pulmonary/Chest: Effort normal. No respiratory distress.  Equal chest rise  Abdominal: Soft. Bowel sounds are normal. She exhibits no distension. There is no tenderness.  Lymphadenopathy:    She has no cervical adenopathy.  Neurological: She is alert.  Skin: Skin is warm and dry.  Psychiatric: She has a normal mood and affect.  Nursing note and vitals reviewed.   ED Course  Dental Date/Time: 11/20/2014 8:46 PM Performed by: Dierdre ForthMUTHERSBAUGH, Derion Kreiter Authorized by: Dierdre ForthMUTHERSBAUGH, Kadarius Cuffe Consent: Verbal consent obtained. Risks and benefits: risks, benefits and alternatives were discussed Consent given by: patient Patient understanding: patient states understanding of the procedure being performed Patient consent: the patient's understanding of the procedure matches consent given Procedure consent: procedure consent matches procedure scheduled Relevant documents: relevant documents present and verified Site marked: the operative site was marked Required items: required blood products, implants, devices, and special equipment available Patient  identity confirmed: verbally with patient and arm band Time out: Immediately prior to procedure a "time out" was called to verify the correct patient, procedure, equipment, support staff and site/side marked as required. Preparation: Patient was prepped and draped in the usual sterile fashion. Local anesthesia used: yes Local anesthetic: bupivacaine 0.5% with epinephrine Anesthetic total: 1.8 ml Patient sedated: no Patient tolerance: Patient tolerated the procedure well with no immediate complications Comments: Dental block of tooth #5 and tooth #32 with complete relief   (including critical care time)  DIAGNOSTIC STUDIES: Oxygen Saturation is 100% on RA, Normal by my interpretation.    COORDINATION OF CARE: 8:35 PM- Will give dental block. Discussed treatment plan with pt at bedside and pt agreed to plan.     Labs Review Labs Reviewed - No data to display  Imaging Review No results found.   EKG Interpretation None      MDM   Final diagnoses:  Pain due to dental caries  Broken tooth, closed, initial encounter   Lucrezia StarchKimberly Noboa presents with dental pain. No gross abscess.  Exam unconcerning for Ludwig's angina or spread of infection.  Will treat with penicillin and pain medicine.  Urged patient to follow-up with dentist.    BP 139/82 mmHg  Pulse 70  Temp(Src) 98.5 F (36.9 C) (Oral)  Resp 18  SpO2 100%  LMP 11/18/2014 (Exact Date) I personally performed the services described in this documentation, which  was scribed in my presence. The recorded information has been reviewed and is accurate.   Dahlia Client Darcel Frane, PA-C 11/20/14 1610  Bethann Berkshire, MD 11/20/14 2221

## 2014-11-20 NOTE — ED Notes (Signed)
Pt c/o right upper dental pain for three weeks. Pt unable to see dentist. Pt is not getting any relief with OTC medications.

## 2014-11-20 NOTE — Discharge Instructions (Signed)
1. Medications: penicillin, naprosyn, usual home medications 2. Treatment: rest, drink plenty of fluids, take medications as prescribed 3. Follow Up: Please followup with dentistry within 1 week for discussion of your diagnoses and further evaluation after today's visit; if you do not have a primary care doctor use the resource guide provided to find one; Return to the ER for high fevers, difficulty breathing, difficulty swallowing or other concerning symptoms    Dental Caries Dental caries (also called tooth decay) is the most common oral disease. It can occur at any age but is more common in children and young adults.  HOW DENTAL CARIES DEVELOPS  The process of decay begins when bacteria and foods (particularly sugars and starches) combine in your mouth to produce plaque. Plaque is a substance that sticks to the hard, outer surface of a tooth (enamel). The bacteria in plaque produce acids that attack enamel. These acids may also attack the root surface of a tooth (cementum) if it is exposed. Repeated attacks dissolve these surfaces and create holes in the tooth (cavities). If left untreated, the acids destroy the other layers of the tooth.  RISK FACTORS  Frequent sipping of sugary beverages.   Frequent snacking on sugary and starchy foods, especially those that easily get stuck in the teeth.   Poor oral hygiene.   Dry mouth.   Substance abuse such as methamphetamine abuse.   Broken or poor-fitting dental restorations.   Eating disorders.   Gastroesophageal reflux disease (GERD).   Certain radiation treatments to the head and neck. SYMPTOMS In the early stages of dental caries, symptoms are seldom present. Sometimes white, chalky areas may be seen on the enamel or other tooth layers. In later stages, symptoms may include:  Pits and holes on the enamel.  Toothache after sweet, hot, or cold foods or drinks are consumed.  Pain around the tooth.  Swelling around the  tooth. DIAGNOSIS  Most of the time, dental caries is detected during a regular dental checkup. A diagnosis is made after a thorough medical and dental history is taken and the surfaces of your teeth are checked for signs of dental caries. Sometimes special instruments, such as lasers, are used to check for dental caries. Dental X-ray exams may be taken so that areas not visible to the eye (such as between the contact areas of the teeth) can be checked for cavities.  TREATMENT  If dental caries is in its early stages, it may be reversed with a fluoride treatment or an application of a remineralizing agent at the dental office. Thorough brushing and flossing at home is needed to aid these treatments. If it is in its later stages, treatment depends on the location and extent of tooth destruction:   If a small area of the tooth has been destroyed, the destroyed area will be removed and cavities will be filled with a material such as gold, silver amalgam, or composite resin.   If a large area of the tooth has been destroyed, the destroyed area will be removed and a cap (crown) will be fitted over the remaining tooth structure.   If the center part of the tooth (pulp) is affected, a procedure called a root canal will be needed before a filling or crown can be placed.   If most of the tooth has been destroyed, the tooth may need to be pulled (extracted). HOME CARE INSTRUCTIONS You can prevent, stop, or reverse dental caries at home by practicing good oral hygiene. Good oral hygiene includes:  Thoroughly cleaning your teeth at least twice a day with a toothbrush and dental floss.   Using a fluoride toothpaste. A fluoride mouth rinse may also be used if recommended by your dentist or health care provider.   Restricting the amount of sugary and starchy foods and sugary liquids you consume.   Avoiding frequent snacking on these foods and sipping of these liquids.   Keeping regular visits with a  dentist for checkups and cleanings. PREVENTION   Practice good oral hygiene.  Consider a dental sealant. A dental sealant is a coating material that is applied by your dentist to the pits and grooves of teeth. The sealant prevents food from being trapped in them. It may protect the teeth for several years.  Ask about fluoride supplements if you live in a community without fluorinated water or with water that has a low fluoride content. Use fluoride supplements as directed by your dentist or health care provider.  Allow fluoride varnish applications to teeth if directed by your dentist or health care provider. Document Released: 01/16/2002 Document Revised: 09/10/2013 Document Reviewed: 04/28/2012 [facility] Patient Information 2015 [place], [place]. This information is not intended to replace advice given to you by your health care provider. Make sure you discuss any questions you have with your health care provider.    Emergency Department Resource Guide 1) Find a Doctor and Pay Out of Pocket Although you won't have to find out who is covered by your insurance plan, it is a good idea to ask around and get recommendations. You will then need to call the office and see if the doctor you have chosen will accept you as a new patient and what types of options they offer for patients who are self-pay. Some doctors offer discounts or will set up payment plans for their patients who do not have insurance, but you will need to ask so you aren't surprised when you get to your appointment.  2) Contact Your Local Health Department Not all health departments have doctors that can see patients for sick visits, but many do, so it is worth a call to see if yours does. If you don't know where your local health department is, you can check in your phone book. The CDC also has a tool to help you locate your state's health department, and many state websites also have listings of all of their local health  departments.  3) Find a Walk-in Clinic If your illness is not likely to be very severe or complicated, you may want to try a walk in clinic. These are popping up all over the country in pharmacies, drugstores, and shopping centers. They're usually staffed by nurse practitioners or physician assistants that have been trained to treat common illnesses and complaints. They're usually fairly quick and inexpensive. However, if you have serious medical issues or chronic medical problems, these are probably not your best option.  No Primary Care Doctor: - Call Health Connect at  [phone] - they can help you locate a primary care doctor that  accepts your insurance, provides certain services, etc. - Physician Referral Service- [phone]  Chronic Pain Problems: Organization         Address  Phone   Notes  [facility]  [phone] Patients need to be referred by their primary care doctor.   Medication Assistance: Organization         Address  Phone   Notes  [facility] Medication Assistance Program [address]  [address] [place], [place] [address] [phone]) [phone] --Must be a resident of [place] -- Must have NO insurance coverage whatsoever (no Medicaid/ Medicare, etc.) -- The pt. MUST have a primary care doctor that directs their care regularly and follows them in the community   [facility]ist  [phone]) [phone]   [employer]  [phone]) [phone]    Agencies that provide inexpensive medical care: Organization         Address  Phone   Notes  [facility]  [phone]   [facility]    [phone]   [facility] [address] [place], [place] [address] [phone]) [phone]   [facility] [address], [place] [phone]) [phone]   [facility]    [phone](336) [phone]   [facility]    [phone]) [phone]   [facility]  [address], [place] Phone:  [phone](336)  [phone], Fax:  [phone] Hours of Operation:  9 am - 6 pm, M-F.  Also accepts Medicaid/Medicare and self-pay.  [facility]  [address], [place] Phone: [phone]) [phone], Fax: [phone]) [phone]. Hours of Operation:  8:30 am - 5:30 pm, M-F.  Also accepts Medicaid and self-pay.  [facility] [place] [address], [place] Phone: [phone](336) [phone]   [facility] [address], [place], [place] [phone], Ext. [phone] Mondays & Thursdays: 7-9 AM.  First 15 patients are seen on a first come, first serve basis.    Medicaid-accepting [facility] [place] Providers:  Organization         Address  Phone   Notes  [facility] [address], [place] [phone]) [phone] Also accepts self-pay patients.  [facility] [address] [place] [address], [place]  [phone]) [phone]   [facility] [address], [place] [phone]) [phone]   [facility] [address] [place] [address], [place] [phone]) [phone]   Renaye RakersVeita Bland [address], [place]   [phone]) [phone] Only accepts [place] Access [place] patients after they have their name applied to their card.   Self-Pay (no insurance) in [facility]:  Organization         Address  Phone   Notes  Sickle Cell Patients, [facility] [address], [place] [phone](336) [phone]   [facility] [address], [place] [phone]) [phone]   [facility] [place]  [address], [place] [phone]) [phone]   [facility]/Dr. Osei-Bonsu  [address] [place] [address], [place] or [phone], [place] [phone] Phone number for both [place] and [place] locations is the same.  [facility] [address], [place] [phone](336) [phone]   [facility] [place] [address] [place] [address], [place] or [address] [phone])  [phone]   [facility] [address], [place] [phone]) [phone], phone; [phone]) [phone], fax Sees patients 1st and 3rd Saturday of every month.  Must not qualify for public or private insurance (i.e. Medicaid, Medicare, [place] Health Choice, Veterans' Benefits)  Household income should be no more than 200% of the poverty level The clinic cannot treat you if you are pregnant or think you are pregnant  Sexually transmitted diseases are not treated at the clinic.    Dental  Care: Organization         Address  Phone  Notes  [facility] Department of [facility] [address] [place], [place] [phone] Accepts children up to age 10 who are enrolled in [place] or [place] Health Choice; pregnant women with a Medicaid card; and children who have applied for Medicaid or [place] Health Choice, but were declined, whose parents can pay a reduced fee at time of service.  [facility]  [address], [place] [phone] Accepts children up to age 58 who are enrolled in [place] or [place] Health Choice; pregnant women with a Medicaid card; and children who have applied for Medicaid or [place] Health Choice, but were declined, whose parents can pay a reduced fee at time of service.  [facility]  [address] [place], [place] [phone] Patients are seen by appointment only. Walk-ins are not accepted. [facility] will see patients 42 years of age and older. Monday - Tuesday (8am-5pm) Most Wednesdays (8:30-5pm) $30 per visit, cash only  [facility] Adult Dental Access PRO[facility]  [address], [facility] [phone] Patients are seen by appointment only. Walk-ins are not accepted. [facility] will see patients 46 years of age and older. One Wednesday Evening (Monthly: Volunteer Based).  $30 per visit, cash only  [employer]  [phone] for adults;  Children under age 64, call Graduate Pediatric Dentistry at [phone]. Children aged 86-14, please call [phone] to request a pediatric application.  Dental services are provided in all areas of dental care including fillings, crowns and bridges, complete and partial dentures, implants, gum treatment, root canals, and extractions. Preventive care is also provided. Treatment is provided to both adults and children. Patients are selected via a lottery and there is often a waiting list.   [facility] [address], [place]  [phone] [email or web address]   [facility] [address] [place], [place] [phone], Ext. [phone] Second and Fourth Thursday of each month, opens at 6:30 AM; Clinic ends at 9 AM.  Patients are seen on a first-come first-served basis, and a limited number are seen during each clinic.   [facility]  [address] [place], [place] [phone]   Eligibility Requirements You must have lived in [place], [place], or [place] counties for at least the last three months.   You cannot be eligible for state or federal sponsored [place], including [employer], [place], or [employer].   You generally cannot be eligible for healthcare insurance through your employer.    How to apply: Eligibility screenings are held every Tuesday and Wednesday afternoon from 1:00 pm until 4:00 pm. You do not need an appointment for the interview!  [facility] [address] [place], [place] [phone]   [facility]  [phone]   [facility]  [phone]   [facility]  [phone]    Behavioral Health Resources in the Community: Intensive Outpatient Programs Organization         Address  Phone  Notes  [facility] [address], [place], [place] [phone]   [facility] Outpatient [address] [place], [place] [phone]   [facility] [address] [place], [place]  [phone]   [facility] [address],  [place], [place] [phone] or [phone]   Substance Abuse Resources Organization         Address  Phone  Notes  [facility]  [phone]   [facility]  [phone]   [facility]  [phone]   Floydene Flock  [phone]   Residential & Outpatient Substance Abuse Program  [phone]   Psychological Services Organization         Address  Phone  Notes  [facility]  [phone]   [facility]  [phone]   [facility] [address], [place] [phone] or [phone]    Mobile Crisis Teams Organization         Address  Phone  Notes  [facility], Mobile Crisis Care Unit  [phone]   [facility]  [address] [place], [place] [phone]   Doristine Locks [address] [place] [phone]    Self-Help/Support Groups Organization         Address  Phone             Notes  [facility] [place] - variety of support groups  336- I7437963 Call for more information  Narcotics Anonymous (NA), [facility] [address], [employer] [place]  2 meetings at this location   Statistician         Address  Phone  Notes  [facility] [address],    [place]  [phone]   [facility]  [address] [place] [address], [place], [place] [phone]   [facility] [address] [place], [place] [phone] Admissions: 8am-3pm M-F  [facility] [address]    [place], [place] [phone]   [facility] [address] [place], [place], [place] [phone]   [facility] [address],  [place], [place] [phone]   [facility] - Intensive  Outpatient [address], [place], [place] [phone]   [facility] ([facility] [address] [place]  [place], [place] [phone] or [phone]   [facility] ([facility]) [address] [place], [place] [phone] Accepts Medicaid  [facility] [place] [address]  [place] [phone] Substance Abuse/Addiction Treatment   [facility] Organization         Address  Phone  Notes  [facility]  5317242452   Angie Fava, PhD [address] [place], [place]   [phone] or [phone]   [facility] Behavioral   [address] [place], [place] [phone]   [facility] [address] [place], [place] [phone] Insurance/Medicaid/sponsorship through [facility] [address]                                    [place], [place] [phone] Therapy/tele-psych/case  [facility] [address], [place] [phone]    Dr. Lolly Mustache  [phone]   [facility]  [employer] [facility] Dept. 1) [address]  2) [address] [place] 3)  [address], [place] [phone]  [phone]   [facility] Child Abuse Hotline 8100093642 or [phone] (After Hours)

## 2015-12-24 ENCOUNTER — Encounter (HOSPITAL_COMMUNITY): Payer: Self-pay | Admitting: *Deleted

## 2015-12-24 ENCOUNTER — Emergency Department (HOSPITAL_COMMUNITY)
Admission: EM | Admit: 2015-12-24 | Discharge: 2015-12-24 | Disposition: A | Discharge status: Home / Self Care | Payer: Medicaid Other | Attending: Emergency Medicine | Admitting: Emergency Medicine

## 2015-12-24 DIAGNOSIS — K0889 Other specified disorders of teeth and supporting structures: Secondary | ICD-10-CM

## 2015-12-24 DIAGNOSIS — Z87891 Personal history of nicotine dependence: Secondary | ICD-10-CM | POA: Insufficient documentation

## 2015-12-24 DIAGNOSIS — K029 Dental caries, unspecified: Secondary | ICD-10-CM | POA: Insufficient documentation

## 2015-12-24 MED ORDER — PENICILLIN V POTASSIUM 500 MG PO TABS: 500.0000 mg | ORAL_TABLET | Freq: Four times a day (QID) | ORAL | 0 refills | Status: AC

## 2015-12-24 MED ORDER — KETOROLAC TROMETHAMINE 30 MG/ML IJ SOLN
30.0000 mg | Freq: Once | INTRAMUSCULAR | Status: AC
  Administered 2015-12-24: 30 mg via INTRAMUSCULAR
  Filled 2015-12-24: qty 1

## 2015-12-24 MED ORDER — NAPROXEN 500 MG PO TABS: 500.0000 mg | ORAL_TABLET | Freq: Two times a day (BID) | ORAL | 0 refills | Status: DC

## 2015-12-24 NOTE — ED Provider Notes (Signed)
[facility]-EMERGENCY DEPT Provider Note   CSN: [id number] Arrival date & time: 12/24/15  1319  By signing my name below, I, Gasper Sellsobert Ryan Bunker HillHalas, attest that this documentation has been prepared under the direction and in the presence of Noelle PennerSerena Maylene Crocker, [place]-C. Electronically Signed: Javier Dockerobert Ryan Halas, ER Scribe. 12/20/2015. 2:36 PM.   First MD Initiated Contact with Patient 12/24/15 0232    History   Chief Complaint Chief Complaint  Patient presents with  . Dental Pain   The history is provided by the patient. No language interpreter was used.    HPI Comments: Robin Cochran is a 30 y.o. female who presents to the Emergency Department complaining of dental pain in her upper and lower back teeth for the last six months. For two days the pain has gotten significantly worse. She has been taking tylenol for pain. She denies fever, chills or difficulty swallowing. She has a mildly sore throat and some neck pain.  She is also complaining of moderate intermediate hand pain. 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    History reviewed. No pertinent past medical history.  Patient Active Problem List   Diagnosis Date Noted  . 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 Asymptomatic bacteriuria in pregnancy 09/22/2012  . Unspecified vitamin D deficiency 09/22/2012  . Previous cesarean section 09/18/2012  . Supervision of other normal pregnancy 09/18/2012    Past Surgical History:  Procedure Laterality Date  . CESAREAN SECTION    . TONSILLECTOMY      OB History    Gravida Para Term Preterm AB Living   2 1 1     1    SAB TAB Ectopic Multiple Live Births           1       Home Medications    Prior to Admission medications   Medication Sig Start Date End Date Taking? Authorizing Provider  acetaminophen (TYLENOL) 325 MG tablet Take 650 mg by mouth every 6 (six) hours as needed for pain.    Historical Provider, MD  metroNIDAZOLE (FLAGYL) 500 MG tablet Take  1 tablet (500 mg total) by mouth 2 (two) times daily. 09/18/12   Antionette CharLisa Jackson-Moore, MD  naproxen (NAPROSYN) 500 MG tablet Take 1 tablet (500 mg total) by mouth 2 (two) times daily with a meal. 11/20/14   Hannah Muthersbaugh, PA-C  penicillin v potassium (VEETID) 500 MG tablet Take 1 tablet (500 mg total) by mouth 3 (three) times daily. 11/20/14   Hannah Muthersbaugh, PA-C  Prenat-FeCbn-FeAspGl-FA-Omega (OB COMPLETE PETITE) 35-5-1-200 MG CAPS Take 1 capsule by mouth daily. 10/10/12   Antionette CharLisa Jackson-Moore, MD  sulfamethoxazole-trimethoprim (BACTRIM DS) 800-160 MG per tablet Take 1 tablet by mouth 2 (two) times daily. 10/10/12   Antionette CharLisa Jackson-Moore, MD    Family History Family History  Problem Relation Age of Onset  . Arthritis Mother   . Hypertension Father   . Hypertension Paternal Grandmother     Social History Social History  Substance Use Topics  . Smoking status: Former Smoker    Types: E-cigarettes    Quit date: 05/11/2011  . Smokeless tobacco: Never Used     Comment: pt states, "I vape sometimes."  . Alcohol use Yes    Allergies   Review of patient's allergies indicates no known allergies.   Review of Systems Review of Systems At least 10pt or greater review of systems completed and are negative except where specified in the HPI.  Physical Exam Updated Vital Signs BP 131/91 (BP Location: Left Arm)   Pulse 62   Temp 98.4 F (36.9 C) (Oral)   Resp 12   Ht 5\' 6"  (1.676 m)   Wt 181 lb 1 oz (82.1 kg)   LMP 12/24/2015   SpO2 100%   Breastfeeding? No   BMI 29.22 kg/m   Physical Exam  Constitutional: She is oriented to person, place, and time. She appears well-developed and well-nourished. No distress.  HENT:  Head: Normocephalic and atraumatic.  Generally poor dentition. Lower right molars decaying. Left upper and lower molars with cavities, some broken fillings. No gross abscess. Uvula midline. No trismus  Eyes: Conjunctivae are normal.  Neck: Normal range of motion. Neck  supple.  Cardiovascular: Normal rate.   Pulmonary/Chest: Effort normal. No respiratory distress.  Musculoskeletal: Normal range of motion.  Left hand nontender, FROM. 2+ radial pulse brisk cap refill  Lymphadenopathy:    She has no cervical adenopathy.  Neurological: She is alert and oriented to person, place, and time. Coordination normal.  Skin: Skin is warm and dry. She is not diaphoretic.  Psychiatric: She has a normal mood and affect. Her behavior is normal.  Nursing note and vitals reviewed.    ED Treatments / Results  DIAGNOSTIC STUDIES: Oxygen Saturation is 100% on RA, normal by my interpretation.    COORDINATION OF CARE: 2:39 PM Discussed treatment plan with pt at bedside which includes pain management, abx and dental resources. Pt agreed to plan.  Labs (all labs ordered are listed, but only abnormal results are displayed) Labs Reviewed - No data to display  EKG  EKG Interpretation None       Radiology No results found.  Procedures Procedures (including critical care time)  Medications Ordered in ED Medications  ketorolac (TORADOL) 30 MG/ML injection 30 mg (30 mg Intramuscular Given 12/24/15 1450)     Initial Impression / Assessment and Plan / ED Course  I have reviewed the triage vital signs and the nursing notes.  Pertinent labs & imaging results that were available during my care of the patient were reviewed by me and considered in my medical decision making (see chart for details).  Clinical Course    Discussed with pt that for definitive dental tx she needs to see a dentist. She states she does not get dental insurance for another 60 days with her job so Writerdental resource guide provided. IM toradol in the ED with rx for pen VK and naproxen at home. No gross abscess visible today. Regarding her hand pain, she does hair and knits a lot so I discussed arthritic vs carpal tunnel etiologies with pt. Naproxen should help her hand pain as well and encouraged to  try nighttime braces for her pain. ER return precautions given.  Final Clinical Impressions(s) / ED Diagnoses   Final diagnoses:  Pain, dental    New Prescriptions New Prescriptions   NAPROXEN (NAPROSYN) 500 MG TABLET    Take 1 tablet (500 mg total) by mouth 2 (two) times daily.   PENICILLIN V POTASSIUM (VEETID) 500 MG TABLET    Take 1 tablet (500 mg total) by mouth 4 (four) times daily.    I personally performed the services described in this documentation, which was scribed in my presence. The recorded information has been reviewed and is accurate.   Carlene CoriaSerena Y Caffie Sotto, PA-C 12/24/15 1457    Donnetta HutchingBrian Cook, MD 12/28/15 [phone]
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
## 2015-12-24 NOTE — Discharge Instructions (Signed)
Please see the attached list for a list of local dental resources.

## 2015-12-24 NOTE — ED Notes (Signed)
Declined W/C at D/C and was escorted to lobby by RN. 

## 2017-05-10 NOTE — L&D Delivery Note (Signed)
Patient: Robin Cochran MRN: [id number]  GBS status: positive, IAP given: PCNx3 doses  Patient is a 32 y.o. now G3P3 s/p NSVD at 8112w5d, who was admitted for IOL for NRNST/BPP 6/10. SROM 3h 7049m prior to delivery with clear fluid.   Head delivered ROA. Nuchal cord present x 1. Shoulder dystocia with anterior shoulder delivered with McRoberts, corkscrew and suprapubic pressure. Body delivered in usual fashion. Infant with poor tone initially, placed on mother's abdomen, dried and bulb suctioned. Cord clamped x 2 immediately, and cut by delivery provider. Cord blood drawn. Placenta delivered spontaneously with gentle cord traction. Fundus firm with massage and Pitocin. Perineum inspected and found to have no lacerations.  Right periurethral laceration repaired with 3.0 vicryl with good hemostasis achieved.  Delivery Note At 4:00 AM a viable female was delivered via VBAC, Spontaneous. APGAR: 6, 9; weight: pending (appears AGA)  .   Placenta status: intact, 3-vessel cord.  Cord:  with the following complications: loose nuchal x 1.  Cord pH: collected (pending)  Anesthesia: Epidural + lidocaine for repair  Episiotomy: None Lacerations: Periurethral Suture Repair: 3.0 vicryl Est. Blood Loss (mL):  300ml  Mom to postpartum.  Baby to Couplet care / Skin to Skin.  Please schedule this patient for Postpartum visit in: 4 weeks with the following provider: Any provider For C/S patients schedule nurse incision check in weeks 2 weeks: no Low risk pregnancy complicated by: none Delivery mode:  VBAC Anticipated Birth Control:  BTL done PP PP Procedures needed: none  Schedule Integrated BH visit: no  [name] 04/19/2018, 4:26 AM

## 2017-09-07 ENCOUNTER — Inpatient Hospital Stay (HOSPITAL_COMMUNITY)
Admission: AD | Admit: 2017-09-07 | Discharge: 2017-09-07 | Disposition: A | Discharge status: Home / Self Care | Payer: BLUE CROSS/BLUE SHIELD | Source: Ambulatory Visit | Attending: Obstetrics & Gynecology | Admitting: Obstetrics & Gynecology

## 2017-09-07 ENCOUNTER — Encounter (HOSPITAL_COMMUNITY): Payer: Self-pay | Admitting: *Deleted

## 2017-09-07 DIAGNOSIS — R1030 Lower abdominal pain, unspecified: Secondary | ICD-10-CM | POA: Diagnosis not present

## 2017-09-07 DIAGNOSIS — O26891 Other specified pregnancy related conditions, first trimester: Secondary | ICD-10-CM

## 2017-09-07 DIAGNOSIS — O219 Vomiting of pregnancy, unspecified: Secondary | ICD-10-CM

## 2017-09-07 DIAGNOSIS — Z3A13 13 weeks gestation of pregnancy: Secondary | ICD-10-CM | POA: Insufficient documentation

## 2017-09-07 DIAGNOSIS — R109 Unspecified abdominal pain: Secondary | ICD-10-CM | POA: Diagnosis not present

## 2017-09-07 DIAGNOSIS — O210 Mild hyperemesis gravidarum: Secondary | ICD-10-CM | POA: Insufficient documentation

## 2017-09-07 DIAGNOSIS — Z87891 Personal history of nicotine dependence: Secondary | ICD-10-CM | POA: Insufficient documentation

## 2017-09-07 LAB — URINALYSIS, ROUTINE W REFLEX MICROSCOPIC
Bilirubin Urine: NEGATIVE
Glucose, UA: NEGATIVE mg/dL
Hgb urine dipstick: NEGATIVE
Ketones, ur: NEGATIVE mg/dL
Leukocytes, UA: NEGATIVE
Nitrite: NEGATIVE
PH: 7 (ref 5.0–8.0)
Protein, ur: NEGATIVE mg/dL
SPECIFIC GRAVITY, URINE: 1.02 (ref 1.005–1.030)

## 2017-09-07 LAB — WET PREP, GENITAL
SPERM: NONE SEEN
Trich, Wet Prep: NONE SEEN
YEAST WET PREP: NONE SEEN

## 2017-09-07 LAB — POCT PREGNANCY, URINE: Preg Test, Ur: POSITIVE — AB

## 2017-09-07 MED ORDER — PROMETHAZINE HCL 12.5 MG PO TABS: 12.5000 mg | ORAL_TABLET | Freq: Every evening | ORAL | 0 refills | Status: DC | PRN

## 2017-09-07 MED ORDER — METOCLOPRAMIDE HCL 10 MG PO TABS: 10.0000 mg | ORAL_TABLET | Freq: Three times a day (TID) | ORAL | 0 refills | Status: DC

## 2017-09-07 NOTE — MAU Provider Note (Signed)
History     CSN: [id number]  Arrival date and time: 09/07/17 1028   First Provider Initiated Contact with Patient 09/07/17 1138      Chief Complaint  Patient presents with  . Possible Pregnancy  . Abdominal Pain   HPI   Robin Cochran is a 32 y.o. female G78P1002  here in MAU with concerns about pregnancy. Says she is un-certain of her menstrual cycle. Says she has been having nausea and lower abdominal cramping. The cramping has been going on since she found out she was pregnant. The cramping come and goes and she has not taken anything for the pain. She has not started prenatal care and is interested in finding out about OB's in the area. She is also interested in medication for the nausea. No bleeding.   OB History    Gravida  3   Para  1   Term  1   Preterm      AB      Living  2     SAB      TAB      Ectopic      Multiple      Live Births  1           History reviewed. No pertinent past medical history.  Past Surgical History:  Procedure Laterality Date  . CESAREAN SECTION    . TONSILLECTOMY      Family History  Problem Relation Age of Onset  . Arthritis Mother   . Hypertension Father   . Hypertension Paternal Grandmother     Social History   Tobacco Use  . Smoking status: Former Smoker    Types: E-cigarettes    Last attempt to quit: 05/11/2011    Years since quitting: 6.3  . Smokeless tobacco: Never Used  . Tobacco comment: pt states, "I vape sometimes."  Substance Use Topics  . Alcohol use: Yes  . Drug use: No    Allergies: No Known Allergies  No medications prior to admission.   Results for orders placed or performed during the hospital encounter of 09/07/17 (from the past 48 hour(s))  Urinalysis, Routine w reflex microscopic     Status: Abnormal   Collection Time: 09/07/17 10:55 AM  Result Value Ref Range   Color, Urine YELLOW YELLOW   APPearance HAZY (A) CLEAR   Specific Gravity, Urine 1.020 1.005 - 1.030   pH  7.0 5.0 - 8.0   Glucose, UA NEGATIVE NEGATIVE mg/dL   Hgb urine dipstick NEGATIVE NEGATIVE   Bilirubin Urine NEGATIVE NEGATIVE   Ketones, ur NEGATIVE NEGATIVE mg/dL   Protein, ur NEGATIVE NEGATIVE mg/dL   Nitrite NEGATIVE NEGATIVE   Leukocytes, UA NEGATIVE NEGATIVE    Comment: Performed at [facility], [address], [place], [place] [address]  Pregnancy, urine POC     Status: Abnormal   Collection Time: 09/07/17 11:04 AM  Result Value Ref Range   Preg Test, Ur POSITIVE (A) NEGATIVE    Comment:        THE SENSITIVITY OF THIS METHODOLOGY IS >24 mIU/mL   Wet prep, genital     Status: Abnormal   Collection Time: 09/07/17 12:00 PM  Result Value Ref Range   Yeast Wet Prep HPF POC NONE SEEN NONE SEEN   Trich, Wet Prep NONE SEEN NONE SEEN   Clue Cells Wet Prep HPF POC PRESENT (A) NONE SEEN   WBC, Wet Prep HPF POC FEW (A) NONE SEEN    Comment: MANY BACTERIA SEEN  Sperm NONE SEEN     Comment: Performed at [facility], [address], [place], [place] [address]  HIV antibody (routine testing) (NOT for [facility])     Status: None   Collection Time: 09/07/17 12:08 PM  Result Value Ref Range   HIV Screen 4th Generation wRfx Non Reactive Non Reactive    Comment: (NOTE) Performed At: [facility] [address] [place], [place] [id number] Jolene Schimke MD ON:[id number] Performed at [facility], [address], [place], [place] [address]     Review of Systems  Gastrointestinal: Positive for abdominal pain and nausea. Negative for vomiting.  Genitourinary: Negative for dysuria, vaginal bleeding and vaginal discharge.   Physical Exam   Blood pressure 128/73, pulse 60, temperature 98.6 F (37 C), temperature source Oral, resp. rate 16, height  (1.676 m), weight 191 lb (86.6 kg), last menstrual period 06/06/2017, SpO2 99 %.  Physical Exam  Constitutional: She is oriented to person, place, and time. She appears well-developed and well-nourished. No distress.   HENT:  Head: Normocephalic.  Respiratory: Effort normal.  GI: Soft. She exhibits no distension. There is no tenderness. There is no rebound.  Genitourinary:  Genitourinary Comments: Wet prep and GC collected without speculum  Bimanual exam: Cervix closed Uterus non tender, enlarged  Adnexa non tender, no masses bilaterally GC/Chlam, wet prep done Chaperone present for exam.   Musculoskeletal: Normal range of motion.  Neurological: She is alert and oriented to person, place, and time.  Skin: Skin is warm. She is not diaphoretic.  Psychiatric: Her behavior is normal.    MAU Course  Procedures  None  MDM  Wet prep & GC HIV, CBC, Hcg, ABO US OB limited done at the bedside by NP, crown rump length approximately [redacted]w[redacted]d unofficially. Active fetus with fetal HR of 140's. Pictures given to patient   Assessment and Plan   A:  1. Abdominal pain in pregnancy, first trimester   2. Nausea and vomiting in pregnancy prior to [redacted] weeks gestation    P:  Discharge home in stable condition Rx: Reglan and phenergan. Do not take together Start prenatal care Prenatal vitamins daily Small, frequent meals  Robin Cochran, Harolyn Rutherford, NP 09/08/2017 8:03 AM

## 2017-09-07 NOTE — Discharge Instructions (Signed)

## 2017-09-07 NOTE — MAU Note (Signed)
Positive home preg test today, cramping off/on for 2 days. LMP 06/06/2017

## 2017-09-08 LAB — GC/CHLAMYDIA PROBE AMP (~~LOC~~) NOT AT ARMC
Chlamydia: NEGATIVE
Neisseria Gonorrhea: NEGATIVE

## 2017-09-08 LAB — HIV ANTIBODY (ROUTINE TESTING W REFLEX): HIV Screen 4th Generation wRfx: NONREACTIVE

## 2017-10-20 ENCOUNTER — Encounter (HOSPITAL_COMMUNITY): Payer: Self-pay | Admitting: Advanced Practice Midwife

## 2017-10-20 ENCOUNTER — Inpatient Hospital Stay (HOSPITAL_COMMUNITY)
Admission: AD | Admit: 2017-10-20 | Discharge: 2017-10-20 | Disposition: A | Discharge status: Home / Self Care | Payer: Medicaid Other | Source: Ambulatory Visit | Attending: Obstetrics & Gynecology | Admitting: Obstetrics & Gynecology

## 2017-10-20 DIAGNOSIS — O9989 Other specified diseases and conditions complicating pregnancy, childbirth and the puerperium: Secondary | ICD-10-CM

## 2017-10-20 DIAGNOSIS — Z3A19 19 weeks gestation of pregnancy: Secondary | ICD-10-CM | POA: Diagnosis not present

## 2017-10-20 DIAGNOSIS — W19XXXA Unspecified fall, initial encounter: Secondary | ICD-10-CM | POA: Diagnosis not present

## 2017-10-20 DIAGNOSIS — Z87891 Personal history of nicotine dependence: Secondary | ICD-10-CM | POA: Diagnosis not present

## 2017-10-20 DIAGNOSIS — R109 Unspecified abdominal pain: Secondary | ICD-10-CM

## 2017-10-20 DIAGNOSIS — Z8249 Family history of ischemic heart disease and other diseases of the circulatory system: Secondary | ICD-10-CM | POA: Insufficient documentation

## 2017-10-20 DIAGNOSIS — Z79899 Other long term (current) drug therapy: Secondary | ICD-10-CM | POA: Diagnosis not present

## 2017-10-20 DIAGNOSIS — O2242 Hemorrhoids in pregnancy, second trimester: Secondary | ICD-10-CM | POA: Diagnosis not present

## 2017-10-20 DIAGNOSIS — K5900 Constipation, unspecified: Secondary | ICD-10-CM

## 2017-10-20 DIAGNOSIS — O26852 Spotting complicating pregnancy, second trimester: Secondary | ICD-10-CM

## 2017-10-20 DIAGNOSIS — K648 Other hemorrhoids: Secondary | ICD-10-CM

## 2017-10-20 MED ORDER — HYDROCORTISONE ACE-PRAMOXINE 1-1 % RE FOAM: 1.0000 | Freq: Two times a day (BID) | RECTAL | 0 refills | Status: DC

## 2017-10-20 MED ORDER — POLYETHYLENE GLYCOL 3350 17 G PO PACK: 17.0000 g | PACK | Freq: Every day | ORAL | 0 refills | Status: DC

## 2017-10-20 NOTE — MAU Note (Signed)
Pt here with c/o abdominal pain and rectal pain after a fall down two stairs. Some spotting noted after the fall.

## 2017-10-20 NOTE — Discharge Instructions (Signed)
Constipation, Adult °Constipation is when a person has fewer bowel movements in a week than normal, has difficulty having a bowel movement, or has stools that are dry, hard, or larger than normal. Constipation may be caused by an underlying condition. It may become worse with age if a person takes certain medicines and does not take in enough fluids. °Follow these instructions at home: °Eating and drinking ° °· Eat foods that have a lot of fiber, such as fresh fruits and vegetables, whole grains, and beans. °· Limit foods that are high in fat, low in fiber, or overly processed, such as french fries, hamburgers, cookies, candies, and soda. °· Drink enough fluid to keep your urine clear or pale yellow. °General instructions °· Exercise regularly or as told by your health care provider. °· Go to the restroom when you have the urge to go. Do not hold it in. °· Take over-the-counter and prescription medicines only as told by your health care provider. These include any fiber supplements. °· Practice pelvic floor retraining exercises, such as deep breathing while relaxing the lower abdomen and pelvic floor relaxation during bowel movements. °· Watch your condition for any changes. °· Keep all follow-up visits as told by your health care provider. This is important. °Contact a health care provider if: °· You have pain that gets worse. °· You have a fever. °· You do not have a bowel movement after 4 days. °· You vomit. °· You are not hungry. °· You lose weight. °· You are bleeding from the anus. °· You have thin, pencil-like stools. °Get help right away if: °· You have a fever and your symptoms suddenly get worse. °· You leak stool or have blood in your stool. °· Your abdomen is bloated. °· You have severe pain in your abdomen. °· You feel dizzy or you faint. °This information is not intended to replace advice given to you by your health care provider. Make sure you discuss any questions you have with your health care  provider. °Document Released: 01/23/2004 Document Revised: 11/14/2015 Document Reviewed: 10/15/2015 °Elsevier Interactive Patient Education © 2018 [employer] ° ° ° °Hemorrhoids °Hemorrhoids are swollen veins in and around the rectum or anus. There are two types of hemorrhoids: °· Internal hemorrhoids. These occur in the veins that are just inside the rectum. They may poke through to the outside and become irritated and painful. °· External hemorrhoids. These occur in the veins that are outside of the anus and can be felt as a painful swelling or hard lump near the anus. ° °Most hemorrhoids do not cause serious problems, and they can be managed with home treatments such as diet and lifestyle changes. If home treatments do not help your symptoms, procedures can be done to shrink or remove the hemorrhoids. °What are the causes? °This condition is caused by increased pressure in the anal area. This pressure may result from various things, including: °· Constipation. °· Straining to have a bowel movement. °· Diarrhea. °· Pregnancy. °· Obesity. °· Sitting for long periods of time. °· Heavy lifting or other activity that causes you to strain. °· Anal sex. ° °What are the signs or symptoms? °Symptoms of this condition include: °· Pain. °· Anal itching or irritation. °· Rectal bleeding. °· Leakage of stool (feces). °· Anal swelling. °· One or more lumps around the anus. ° °How is this diagnosed? °This condition can often be diagnosed through a visual exam. Other exams or tests may also be done, such as: °·   Examination of the rectal area with a gloved hand (digital rectal exam). °· Examination of the anal canal using a small tube (anoscope). °· A blood test, if you have lost a significant amount of blood. °· A test to look inside the colon (sigmoidoscopy or colonoscopy). ° °How is this treated? °This condition can usually be treated at home. However, various procedures may be done if dietary changes, lifestyle changes, and  other home treatments do not help your symptoms. These procedures can help make the hemorrhoids smaller or remove them completely. Some of these procedures involve surgery, and others do not. Common procedures include: °· Rubber band ligation. Rubber bands are placed at the base of the hemorrhoids to cut off the blood supply to them. °· Sclerotherapy. Medicine is injected into the hemorrhoids to shrink them. °· Infrared coagulation. A type of light energy is used to get rid of the hemorrhoids. °· Hemorrhoidectomy surgery. The hemorrhoids are surgically removed, and the veins that supply them are tied off. °· Stapled hemorrhoidopexy surgery. A circular stapling device is used to remove the hemorrhoids and use staples to cut off the blood supply to them. ° °Follow these instructions at home: °Eating and drinking °· Eat foods that have a lot of fiber in them, such as whole grains, beans, nuts, fruits, and vegetables. Ask your health care provider about taking products that have added fiber (fiber supplements). °· Drink enough fluid to keep your urine clear or pale yellow. °Managing pain and swelling °· Take warm sitz baths for 20 minutes, 3-4 times a day to ease pain and discomfort. °· If directed, apply ice to the affected area. Using ice packs between sitz baths may be helpful. °? Put ice in a plastic bag. °? Place a towel between your skin and the bag. °? Leave the ice on for 20 minutes, 2-3 times a day. °General instructions °· Take over-the-counter and prescription medicines only as told by your health care provider. °· Use medicated creams or suppositories as told. °· Exercise regularly. °· Go to the bathroom when you have the urge to have a bowel movement. Do not wait. °· Avoid straining to have bowel movements. °· Keep the anal area dry and clean. Use wet toilet paper or moist towelettes after a bowel movement. °· Do not sit on the toilet for long periods of time. This increases blood pooling and pain. °Contact  a health care provider if: °· You have increasing pain and swelling that are not controlled by treatment or medicine. °· You have uncontrolled bleeding. °· You have difficulty having a bowel movement, or you are unable to have a bowel movement. °· You have pain or inflammation outside the area of the hemorrhoids. °This information is not intended to replace advice given to you by your health care provider. Make sure you discuss any questions you have with your health care provider. °Document Released: 04/23/2000 Document Revised: 09/24/2015 Document Reviewed: 01/08/2015 °Elsevier Interactive Patient Education © 2018 Elsevier Inc[employer] ° °

## 2017-10-20 NOTE — MAU Provider Note (Signed)
Chief Complaint:  Abdominal Pain and Rectal Pain   First Provider Initiated Contact with Patient 10/20/17 2118     HPI: Robin Cochran is a 32 y.o. G3P1002 at 3619w3dwho presents to maternity admissions reporting fall this evening which made her land on her buttocks. States it made her hemorrhoid hurt. Reports a small spot of blood, not sure if vaginal .Denies abdominal pain to me. . She reports good fetal movement, denies LOF, vaginal itching/burning, urinary symptoms, h/a, dizziness, n/v, diarrhea, constipation or fever/chills.    Abdominal Pain  This is a new problem. The current episode started today. The onset quality is sudden. The problem occurs intermittently. The problem has been unchanged. The pain is located in the rectum. The quality of the pain is dull. The abdominal pain does not radiate. Pertinent negatives include no constipation, diarrhea, dysuria, fever, nausea or vomiting. The pain is aggravated by certain positions. The pain is relieved by nothing. She has tried nothing for the symptoms.  Vaginal Bleeding  The patient's primary symptoms include vaginal bleeding. The patient's pertinent negatives include no genital itching, genital lesions or genital odor. This is a new problem. The current episode started today. The problem occurs rarely. The problem has been resolved. Associated symptoms include abdominal pain. Pertinent negatives include no chills, constipation, diarrhea, dysuria, fever, nausea or vomiting. The vaginal discharge was bloody. The vaginal bleeding is spotting. She has not been passing clots. She has not been passing tissue. Nothing aggravates the symptoms. She has tried nothing for the symptoms.    RN Note: Pt here with c/o abdominal pain and rectal pain after a fall down two stairs. Some spotting noted after the fall.     Past Medical History: History reviewed. No pertinent past medical history.  Past obstetric history: OB History  Gravida Para Term Preterm  AB Living  3 1 1     2   SAB TAB Ectopic Multiple Live Births          1    # Outcome Date GA Lbr Len/2nd Weight Sex Delivery Anes PTL Lv  3 Current           2 Gravida 2014          1 Term 10/16/09 9160w0d  8 lb 6 oz (3.799 kg) M CS-LTranv EPI  LIV    Past Surgical History: Past Surgical History:  Procedure Laterality Date  . CESAREAN SECTION    . TONSILLECTOMY      Family History: Family History  Problem Relation Age of Onset  . Arthritis Mother   . Hypertension Father   . Hypertension Paternal Grandmother     Social History: Social History   Tobacco Use  . Smoking status: Former Smoker    Types: E-cigarettes    Last attempt to quit: 05/11/2011    Years since quitting: 6.4  . Smokeless tobacco: Never Used  . Tobacco comment: pt states, "I vape sometimes."  Substance Use Topics  . Alcohol use: Yes  . Drug use: No    Allergies: No Known Allergies  Meds:  Medications Prior to Admission  Medication Sig Dispense Refill Last Dose  . promethazine (PHENERGAN) 12.5 MG tablet Take 1-2 tablets (12.5-25 mg total) by mouth at bedtime as needed for nausea or vomiting. 30 tablet 0 Past Month at Unknown time  . metoCLOPramide (REGLAN) 10 MG tablet Take 1 tablet (10 mg total) by mouth 3 (three) times daily before meals. 90 tablet 0     I have reviewed patient's  Past Medical Hx, Surgical Hx, Family Hx, Social Hx, medications and allergies.   ROS:  Review of Systems  Constitutional: Negative for chills and fever.  Gastrointestinal: Positive for abdominal pain. Negative for constipation, diarrhea, nausea and vomiting.  Genitourinary: Positive for vaginal bleeding. Negative for dysuria.   Other systems negative  Physical Exam   Patient Vitals for the past 24 hrs:  BP Temp Temp src Pulse Resp SpO2 Height Weight  10/20/17 2051 118/74 98.6 F (37 C) Oral 70 18 99 % 5\' 6"  (1.676 m) 193 lb (87.5 kg)   Constitutional: Well-developed, well-nourished female in no acute distress.   Cardiovascular: normal rate and rhythm Respiratory: normal effort, clear to auscultation bilaterally GI: Abd soft, non-tender, gravid appropriate for gestational age.   No rebound or guarding. Rectal:  Single nonthrombosed hemorrhoid, not bleeding MS: Extremities nontender, no edema, normal ROM Neurologic: Alert and oriented x 4.  GU: Neg CVAT.  PELVIC EXAM: Cervix pink, visually closed, without lesion, scant white creamy discharge, vaginal walls and external genitalia normal   No blood visible Cervix long and closed   FHR 155  Labs:   No results found for this or any previous visit (from the past 24 hour(s)).   Imaging:  No results found.  MAU Course/MDM: Reviewed exam findings with patient Discussed hemorrhoid and usual treatment Discussed baby is well and usually well protected from minor falls at this age Will rx Proctofoam HC.    Assessment: Single IUP at [redacted]w[redacted]d Hemorrhoid pain  S/P fall with fetal heart rate reassuring  Plan: Discharge home Rx Proctofoam [facility] for hemorrhoid Fall precautions Preterm Labor precautions and fetal kick counts Follow up in Office for prenatal visits and recheck  Encouraged to return here or to other Urgent Care/ED if she develops worsening of symptoms, increase in pain, fever, or other concerning symptoms.   Pt stable at time of discharge.  Wynelle Bourgeois CNM, MSN Certified Nurse-Midwife 10/20/2017 9:19 PM

## 2017-11-17 ENCOUNTER — Encounter: Payer: BLUE CROSS/BLUE SHIELD | Admitting: Obstetrics and Gynecology

## 2017-12-07 ENCOUNTER — Ambulatory Visit (INDEPENDENT_AMBULATORY_CARE_PROVIDER_SITE_OTHER): Payer: Medicaid Other | Admitting: Obstetrics

## 2017-12-07 ENCOUNTER — Encounter: Payer: Self-pay | Admitting: Obstetrics

## 2017-12-07 ENCOUNTER — Other Ambulatory Visit (HOSPITAL_COMMUNITY)
Admission: RE | Admit: 2017-12-07 | Discharge: 2017-12-07 | Disposition: A | Discharge status: Home / Self Care | Payer: Medicaid Other | Source: Ambulatory Visit | Attending: Obstetrics | Admitting: Obstetrics

## 2017-12-07 VITALS — BP 78/44 | HR 84 | Wt 204.2 lb

## 2017-12-07 DIAGNOSIS — Z3482 Encounter for supervision of other normal pregnancy, second trimester: Secondary | ICD-10-CM | POA: Diagnosis not present

## 2017-12-07 DIAGNOSIS — N898 Other specified noninflammatory disorders of vagina: Secondary | ICD-10-CM

## 2017-12-07 DIAGNOSIS — Z3483 Encounter for supervision of other normal pregnancy, third trimester: Secondary | ICD-10-CM

## 2017-12-07 DIAGNOSIS — Z3480 Encounter for supervision of other normal pregnancy, unspecified trimester: Secondary | ICD-10-CM | POA: Diagnosis present

## 2017-12-07 DIAGNOSIS — Z3490 Encounter for supervision of normal pregnancy, unspecified, unspecified trimester: Secondary | ICD-10-CM

## 2017-12-07 DIAGNOSIS — O0933 Supervision of pregnancy with insufficient antenatal care, third trimester: Secondary | ICD-10-CM

## 2017-12-07 DIAGNOSIS — Z3493 Encounter for supervision of normal pregnancy, unspecified, third trimester: Secondary | ICD-10-CM

## 2017-12-07 DIAGNOSIS — O0930 Supervision of pregnancy with insufficient antenatal care, unspecified trimester: Secondary | ICD-10-CM

## 2017-12-07 MED ORDER — VITAFOL GUMMIES 3.33-0.333-34.8 MG PO CHEW: 3.0000 | CHEWABLE_TABLET | Freq: Every day | ORAL | 11 refills | Status: DC

## 2017-12-07 NOTE — Progress Notes (Signed)
Subjective:    Robin Cochran is being seen today for her first obstetrical visit.  This is not a planned pregnancy. She is at [redacted]w[redacted]d gestation. Her obstetrical history is significant for none. Relationship with FOB: unknown. Patient does intend to breast feed. Pregnancy history fully reviewed.  The information documented in the HPI was reviewed and verified.  Menstrual History: OB History    Gravida  3   Para  2   Term  2   Preterm      AB      Living  2     SAB      TAB      Ectopic      Multiple      Live Births  2             Patient's last menstrual period was 06/06/2017 (approximate).    History reviewed. No pertinent past medical history.  Past Surgical History:  Procedure Laterality Date  . CESAREAN SECTION    . TONSILLECTOMY       (Not in a hospital admission) No Known Allergies  Social History   Tobacco Use  . Smoking status: Former Smoker    Types: E-cigarettes    Last attempt to quit: 05/11/2011    Years since quitting: 6.5  . Smokeless tobacco: Never Used  . Tobacco comment: pt states, "I vape sometimes."  Substance Use Topics  . Alcohol use: Yes    Family History  Problem Relation Age of Onset  . Arthritis Mother   . Hypertension Father   . Hypertension Paternal Grandmother      Review of Systems Constitutional: negative for weight loss Gastrointestinal: negative for vomiting Genitourinary:negative for genital lesions and vaginal discharge and dysuria Musculoskeletal:negative for back pain Behavioral/Psych: negative for abusive relationship, depression, illegal drug usage and tobacco use    Objective:    BP (!) 78/44   Pulse 84   Wt 204 lb 3.2 oz (92.6 kg)   LMP 06/06/2017 (Approximate)   BMI 32.96 kg/m  General Appearance:    Alert, cooperative, no distress, appears stated age  Head:    Normocephalic, without obvious abnormality, atraumatic  Eyes:    PERRL, conjunctiva/corneas clear, EOM's intact, fundi    benign,  both eyes  Ears:    Normal TM's and external ear canals, both ears  Nose:   Nares normal, septum midline, mucosa normal, no drainage    or sinus tenderness  Throat:   Lips, mucosa, and tongue normal; teeth and gums normal  Neck:   Supple, symmetrical, trachea midline, no adenopathy;    thyroid:  no enlargement/tenderness/nodules; no carotid   bruit or JVD  Back:     Symmetric, no curvature, ROM normal, no CVA tenderness  Lungs:     Clear to auscultation bilaterally, respirations unlabored  Chest Wall:    No tenderness or deformity   Heart:    Regular rate and rhythm, S1 and S2 normal, no murmur, rub   or gallop  Breast Exam:    No tenderness, masses, or nipple abnormality  Abdomen:     Soft, non-tender, bowel sounds active all four quadrants,    no masses, no organomegaly  Genitalia:    Normal female without lesion, discharge or tenderness  Extremities:   Extremities normal, atraumatic, no cyanosis or edema  Pulses:   2+ and symmetric all extremities  Skin:   Skin color, texture, turgor normal, no rashes or lesions  Lymph nodes:   Cervical, supraclavicular, and axillary  nodes normal  Neurologic:   CNII-XII intact, normal strength, sensation and reflexes    throughout      Lab Review Urine pregnancy test Labs reviewed yes Radiologic studies reviewed no Assessment:     1. 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  Dispense: 90 tablet; Refill: 11 - US MFM OB COMP + 14 WK; Future  2. Late prenatal care affecting pregnancy, antepartum  3. Vaginal discharge Rx: - Cervicovaginal ancillary only    Plan:    Prenatal vitamins.  Counseling provided regarding  continued use of seat belts, cessation of alcohol consumption, smoking or use of illicit drugs; infection precautions i.e., influenza/TDAP immunizations, toxoplasmosis,CMV, parvovirus, listeria and varicella; workplace safety, exercise during pregnancy; routine dental care, safe medications, sexual activity, hot tubs, saunas, pools, travel, caffeine use, fish and methlymercury, potential toxins, hair treatments, varicose veins Weight gain recommendations per IOM guidelines reviewed: underweight/BMI< 18.5--> gain 28 - 40 lbs; normal weight/BMI 18.5 - 24.9--> gain 25 - 35 lbs; overweight/BMI 25 - 29.9--> gain 15 - 25 lbs; obese/BMI >30->gain  11 - 20 lbs Problem list reviewed and updated. FIRST/CF mutation testing/NIPT/QUAD SCREEN/fragile X/Ashkenazi Jewish population testing/Spinal muscular atrophy discussed: requested. Role of ultrasound in pregnancy discussed; fetal survey: requested. Amniocentesis discussed: not indicated. VBAC calculator score: VBAC consent form provided   Meds ordered this encounter  Medications  . Prenatal Vit-Fe Phos-FA-Omega (VITAFOL GUMMIES) 3.33-0.333-34.8 MG CHEW    Sig: Chew 3 tablets by mouth daily before breakfast.    Dispense:  90 tablet    Refill:  11   Orders Placed This Encounter  Procedures  . Culture, OB Urine  . US MFM OB COMP + 14 WK    Standing Status:   Future    Standing Expiration Date:   02/07/2019    Order Specific Question:   Reason for Exam (SYMPTOM  OR DIAGNOSIS REQUIRED)    Answer:   Anatomy    Order Specific Question:   Preferred Imaging Location?    Answer:   [facility]Ultrasound  . Hemoglobinopathy evaluation  . Obstetric Panel, Including HIV  . SMN1 Copy Number Analysis  . Cystic Fibrosis Mutation 97    Follow up in 2 weeks. 50% of 20 min visit spent on counseling and coordination of care.     Brock BadHARLES A. Witten Certain MD 12-07-2017

## 2017-12-09 ENCOUNTER — Other Ambulatory Visit: Payer: Self-pay | Admitting: Obstetrics

## 2017-12-09 DIAGNOSIS — A5901 Trichomonal vulvovaginitis: Secondary | ICD-10-CM

## 2017-12-09 LAB — CULTURE, OB URINE

## 2017-12-09 LAB — CERVICOVAGINAL ANCILLARY ONLY
Bacterial vaginitis: NEGATIVE
CANDIDA VAGINITIS: NEGATIVE
CHLAMYDIA, DNA PROBE: NEGATIVE
Neisseria Gonorrhea: NEGATIVE
TRICH (WINDOWPATH): POSITIVE — AB

## 2017-12-09 LAB — URINE CULTURE, OB REFLEX

## 2017-12-09 MED ORDER — TINIDAZOLE 500 MG PO TABS: 2.0000 g | ORAL_TABLET | Freq: Once | ORAL | 0 refills | Status: AC

## 2017-12-10 LAB — OBSTETRIC PANEL, INCLUDING HIV
Basophils Absolute: 0 x10E3/uL (ref 0.0–0.2)
Basos: 0 %
EOS (ABSOLUTE): 0.1 x10E3/uL (ref 0.0–0.4)
Eos: 1 %
HIV Screen 4th Generation wRfx: NONREACTIVE
Hematocrit: 31.3 % — ABNORMAL LOW (ref 34.0–46.6)
Hemoglobin: 10.7 g/dL — ABNORMAL LOW (ref 11.1–15.9)
Hepatitis B Surface Ag: NEGATIVE
Immature Grans (Abs): 0 x10E3/uL (ref 0.0–0.1)
Immature Granulocytes: 0 %
Lymphocytes Absolute: 1.8 x10E3/uL (ref 0.7–3.1)
Lymphs: 19 %
MCH: 29.6 pg (ref 26.6–33.0)
MCHC: 34.2 g/dL (ref 31.5–35.7)
MCV: 87 fL (ref 79–97)
Monocytes Absolute: 0.5 x10E3/uL (ref 0.1–0.9)
Monocytes: 6 %
Neutrophils Absolute: 7.1 x10E3/uL — ABNORMAL HIGH (ref 1.4–7.0)
Neutrophils: 74 %
Platelets: 242 x10E3/uL (ref 150–450)
RBC: 3.62 x10E6/uL — ABNORMAL LOW (ref 3.77–5.28)
RDW: 13.6 % (ref 12.3–15.4)
RPR Ser Ql: NONREACTIVE
Rh Factor: POSITIVE
Rubella Antibodies, IGG: 2.33 {index}
WBC: 9.5 x10E3/uL (ref 3.4–10.8)

## 2017-12-10 LAB — AB SCR+ANTIBODY ID: Antibody Screen: POSITIVE — AB

## 2017-12-10 LAB — HEMOGLOBINOPATHY EVALUATION
HGB C: 0 %
HGB S: 0 %
HGB VARIANT: 0 %
Hemoglobin A2 Quantitation: 2.5 % (ref 1.8–3.2)
Hemoglobin F Quantitation: 0 % (ref 0.0–2.0)
Hgb A: 97.5 % (ref 96.4–98.8)

## 2017-12-12 LAB — CYTOLOGY - PAP
DIAGNOSIS: NEGATIVE
HPV: NOT DETECTED

## 2017-12-13 ENCOUNTER — Encounter (HOSPITAL_COMMUNITY): Payer: Self-pay

## 2017-12-14 LAB — SMN1 COPY NUMBER ANALYSIS (SMA CARRIER SCREENING)

## 2017-12-14 LAB — CYSTIC FIBROSIS MUTATION 97: Interpretation: NOT DETECTED

## 2017-12-20 ENCOUNTER — Other Ambulatory Visit: Payer: Self-pay | Admitting: Obstetrics

## 2017-12-20 ENCOUNTER — Other Ambulatory Visit (HOSPITAL_COMMUNITY): Payer: Self-pay | Admitting: *Deleted

## 2017-12-20 ENCOUNTER — Telehealth: Payer: Self-pay

## 2017-12-20 ENCOUNTER — Ambulatory Visit (HOSPITAL_COMMUNITY)
Admission: RE | Admit: 2017-12-20 | Discharge: 2017-12-20 | Disposition: A | Discharge status: Home / Self Care | Payer: Medicaid Other | Source: Ambulatory Visit | Attending: Obstetrics | Admitting: Obstetrics

## 2017-12-20 DIAGNOSIS — Z3492 Encounter for supervision of normal pregnancy, unspecified, second trimester: Secondary | ICD-10-CM

## 2017-12-20 DIAGNOSIS — Z362 Encounter for other antenatal screening follow-up: Secondary | ICD-10-CM

## 2017-12-20 DIAGNOSIS — O359XX0 Maternal care for (suspected) fetal abnormality and damage, unspecified, not applicable or unspecified: Secondary | ICD-10-CM

## 2017-12-20 DIAGNOSIS — Z363 Encounter for antenatal screening for malformations: Secondary | ICD-10-CM

## 2017-12-20 DIAGNOSIS — Z3A23 23 weeks gestation of pregnancy: Secondary | ICD-10-CM | POA: Diagnosis not present

## 2017-12-20 DIAGNOSIS — Z3490 Encounter for supervision of normal pregnancy, unspecified, unspecified trimester: Secondary | ICD-10-CM

## 2017-12-20 NOTE — Telephone Encounter (Signed)
[facility]

## 2017-12-21 ENCOUNTER — Encounter: Payer: Medicaid Other | Admitting: Obstetrics

## 2017-12-21 ENCOUNTER — Encounter: Payer: Self-pay | Admitting: Obstetrics and Gynecology

## 2017-12-21 ENCOUNTER — Other Ambulatory Visit: Payer: Medicaid Other

## 2017-12-21 DIAGNOSIS — R760 Raised antibody titer: Secondary | ICD-10-CM | POA: Insufficient documentation

## 2017-12-28 ENCOUNTER — Encounter: Payer: Medicaid Other | Admitting: Obstetrics

## 2017-12-28 ENCOUNTER — Other Ambulatory Visit: Payer: Medicaid Other

## 2018-01-17 ENCOUNTER — Ambulatory Visit (HOSPITAL_COMMUNITY)
Admission: RE | Admit: 2018-01-17 | Discharge: 2018-01-17 | Disposition: A | Discharge status: Home / Self Care | Payer: Medicaid Other | Source: Ambulatory Visit | Attending: Obstetrics | Admitting: Obstetrics

## 2018-01-17 ENCOUNTER — Other Ambulatory Visit (HOSPITAL_COMMUNITY): Payer: Self-pay | Admitting: *Deleted

## 2018-01-17 DIAGNOSIS — O359XX0 Maternal care for (suspected) fetal abnormality and damage, unspecified, not applicable or unspecified: Secondary | ICD-10-CM

## 2018-01-17 DIAGNOSIS — Z3A27 27 weeks gestation of pregnancy: Secondary | ICD-10-CM

## 2018-01-17 DIAGNOSIS — Z3A37 37 weeks gestation of pregnancy: Secondary | ICD-10-CM | POA: Insufficient documentation

## 2018-01-17 DIAGNOSIS — Z362 Encounter for other antenatal screening follow-up: Secondary | ICD-10-CM | POA: Diagnosis not present

## 2018-01-24 ENCOUNTER — Other Ambulatory Visit: Payer: Self-pay

## 2018-01-24 ENCOUNTER — Encounter: Payer: Self-pay | Admitting: Obstetrics

## 2018-01-24 ENCOUNTER — Ambulatory Visit (INDEPENDENT_AMBULATORY_CARE_PROVIDER_SITE_OTHER): Payer: Medicaid Other | Admitting: Obstetrics

## 2018-01-24 VITALS — BP 114/73 | HR 73 | Wt 217.0 lb

## 2018-01-24 DIAGNOSIS — O26893 Other specified pregnancy related conditions, third trimester: Secondary | ICD-10-CM

## 2018-01-24 DIAGNOSIS — M545 Low back pain, unspecified: Secondary | ICD-10-CM

## 2018-01-24 DIAGNOSIS — R12 Heartburn: Secondary | ICD-10-CM

## 2018-01-24 DIAGNOSIS — Z3490 Encounter for supervision of normal pregnancy, unspecified, unspecified trimester: Secondary | ICD-10-CM

## 2018-01-24 DIAGNOSIS — O26899 Other specified pregnancy related conditions, unspecified trimester: Secondary | ICD-10-CM

## 2018-01-24 DIAGNOSIS — Z3483 Encounter for supervision of other normal pregnancy, third trimester: Secondary | ICD-10-CM

## 2018-01-24 DIAGNOSIS — G8929 Other chronic pain: Secondary | ICD-10-CM

## 2018-01-24 MED ORDER — COMFORT FIT MATERNITY SUPP SM MISC: 0 refills | Status: DC

## 2018-01-24 MED ORDER — OMEPRAZOLE 20 MG PO CPDR: 20.0000 mg | DELAYED_RELEASE_CAPSULE | Freq: Two times a day (BID) | ORAL | 5 refills | Status: DC

## 2018-01-24 NOTE — Progress Notes (Signed)
Subjective:  Robin Cochran is Cochran 32 y.o. G3P2002 at 8129w4d being seen today for ongoing prenatal care.  She is currently monitored for the following issues for this low-risk pregnancy and has Previous cesarean section; Supervision of other normal pregnancy; GBS carrier; Asymptomatic bacteriuria in pregnancy; Unspecified vitamin D deficiency; Encounter for supervision of normal pregnancy, unspecified, unspecified trimester; and Abnormal antibody  on their problem list.  Patient reports backache and heartburn.  Contractions: Not present. Vag. Bleeding: None.  Movement: Present. Denies leaking of fluid.   The following portions of the patient's history were reviewed and updated as appropriate: allergies, current medications, past family history, past medical history, past social history, past surgical history and problem list. Problem list updated.  Objective:   Vitals:   01/24/18 0904  BP: 114/73  Pulse: 73  Weight: 217 lb (98.4 kg)    Fetal Status: Fetal Heart Rate (bpm): 150   Movement: Present     General:  Alert, oriented and cooperative. Patient is in no acute distress.  Skin: Skin is warm and dry. No rash noted.   Cardiovascular: Normal heart rate noted  Respiratory: Normal respiratory effort, no problems with respiration noted  Abdomen: Soft, gravid, appropriate for gestational age. Pain/Pressure: Present     Pelvic:  Cervical exam deferred        Extremities: Normal range of motion.  Edema: None  Mental Status: Normal mood and affect. Normal behavior. Normal judgment and thought content.   Urinalysis:      Assessment and Plan:  Pregnancy: G3P2002 at 6129w4d  1. Encounter for supervision of normal pregnancy, antepartum, unspecified gravidity Rx: - Glucose Tolerance, 2 Hours w/1 Hour - CBC - HIV antibody (with reflex) - RPR  2. Chronic midline low back pain without sciatica Rx: - Elastic Bandages & Supports (COMFORT FIT MATERNITY SUPP SM) MISC; Wear as directed.  Dispense:  1 each; Refill: 0  3. Heartburn during pregnancy, antepartum Rx: - omeprazole (PRILOSEC) 20 MG capsule; Take 1 capsule (20 mg total) by mouth 2 (two) times daily before Cochran meal.  Dispense: 60 capsule; Refill: 5  Preterm labor symptoms and general obstetric precautions including but not limited to vaginal bleeding, contractions, leaking of fluid and fetal movement were reviewed in detail with the patient. Please refer to After Visit Summary for other counseling recommendations.  Return in about 2 weeks (around 02/07/2018) for ROB.   Robin Cochran, Robin A, MD

## 2018-01-24 NOTE — Progress Notes (Signed)
ROB/GTT.  Declined FLU & TDAP 01/24/18.  C[facility]o back pain, [facility]

## 2018-01-25 ENCOUNTER — Other Ambulatory Visit: Payer: Self-pay | Admitting: Obstetrics

## 2018-01-25 DIAGNOSIS — O99019 Anemia complicating pregnancy, unspecified trimester: Secondary | ICD-10-CM

## 2018-01-25 LAB — CBC
HEMATOCRIT: 32.2 % — AB (ref 34.0–46.6)
Hemoglobin: 10.7 g/dL — ABNORMAL LOW (ref 11.1–15.9)
MCH: 29.6 pg (ref 26.6–33.0)
MCHC: 33.2 g/dL (ref 31.5–35.7)
MCV: 89 fL (ref 79–97)
PLATELETS: 254 10*3/uL (ref 150–450)
RBC: 3.62 x10E6/uL — ABNORMAL LOW (ref 3.77–5.28)
RDW: 14.7 % (ref 12.3–15.4)
WBC: 7.9 10*3/uL (ref 3.4–10.8)

## 2018-01-25 LAB — GLUCOSE TOLERANCE, 2 HOURS W/ 1HR
GLUCOSE, FASTING: 71 mg/dL (ref 65–91)
Glucose, 1 hour: 125 mg/dL (ref 65–179)
Glucose, 2 hour: 93 mg/dL (ref 65–152)

## 2018-01-25 LAB — HIV ANTIBODY (ROUTINE TESTING W REFLEX): HIV Screen 4th Generation wRfx: NONREACTIVE

## 2018-01-25 LAB — RPR: RPR Ser Ql: NONREACTIVE

## 2018-01-25 MED ORDER — FERROUS SULFATE 325 (65 FE) MG PO TABS: 325.0000 mg | ORAL_TABLET | Freq: Two times a day (BID) | ORAL | 5 refills | Status: DC

## 2018-02-07 ENCOUNTER — Encounter: Payer: Medicaid Other | Admitting: Obstetrics

## 2018-03-06 ENCOUNTER — Encounter: Payer: Self-pay | Admitting: Obstetrics

## 2018-03-14 ENCOUNTER — Inpatient Hospital Stay (HOSPITAL_COMMUNITY)
Admission: AD | Admit: 2018-03-14 | Discharge: 2018-03-14 | Disposition: A | Discharge status: Home / Self Care | Payer: Medicaid Other | Source: Ambulatory Visit | Attending: Internal Medicine | Admitting: Internal Medicine

## 2018-03-14 ENCOUNTER — Encounter (HOSPITAL_COMMUNITY): Payer: Self-pay

## 2018-03-14 ENCOUNTER — Ambulatory Visit (HOSPITAL_COMMUNITY)
Admission: RE | Admit: 2018-03-14 | Discharge: 2018-03-14 | Disposition: A | Discharge status: Home / Self Care | Payer: Medicaid Other | Source: Ambulatory Visit | Attending: Obstetrics | Admitting: Obstetrics

## 2018-03-14 DIAGNOSIS — O479 False labor, unspecified: Secondary | ICD-10-CM

## 2018-03-14 DIAGNOSIS — O4703 False labor before 37 completed weeks of gestation, third trimester: Secondary | ICD-10-CM | POA: Diagnosis present

## 2018-03-14 DIAGNOSIS — Z3A35 35 weeks gestation of pregnancy: Secondary | ICD-10-CM | POA: Insufficient documentation

## 2018-03-14 HISTORY — DX: Anemia, unspecified: D64.9

## 2018-03-14 LAB — URINALYSIS, ROUTINE W REFLEX MICROSCOPIC
Bilirubin Urine: NEGATIVE
GLUCOSE, UA: NEGATIVE mg/dL
HGB URINE DIPSTICK: NEGATIVE
KETONES UR: NEGATIVE mg/dL
LEUKOCYTES UA: NEGATIVE
Nitrite: NEGATIVE
PH: 6 (ref 5.0–8.0)
Protein, ur: NEGATIVE mg/dL
Specific Gravity, Urine: 1.023 (ref 1.005–1.030)

## 2018-03-14 NOTE — MAU Note (Signed)
Pt here for contractions that started about 1-2 hours ago. States she feels them every 10-15 mins. Pt denies vaginal bleeding or LOF. Reports good fetal movement.

## 2018-03-14 NOTE — Discharge Instructions (Signed)
Braxton Hicks Contractions °Contractions of the uterus can occur throughout pregnancy, but they are not always a sign that you are in labor. You may have practice contractions called Braxton Hicks contractions. These false labor contractions are sometimes confused with true labor. °What are Braxton Hicks contractions? °Braxton Hicks contractions are tightening movements that occur in the muscles of the uterus before labor. Unlike true labor contractions, these contractions do not result in opening (dilation) and thinning of the cervix. Toward the end of pregnancy (32-34 weeks), Braxton Hicks contractions can happen more often and may become stronger. These contractions are sometimes difficult to tell apart from true labor because they can be very uncomfortable. You should not feel embarrassed if you go to the hospital with false labor. °Sometimes, the only way to tell if you are in true labor is for your health care provider to look for changes in the cervix. The health care provider will do a physical exam and may monitor your contractions. If you are not in true labor, the exam should show that your cervix is not dilating and your water has not broken. °If there are other health problems associated with your pregnancy, it is completely safe for you to be sent home with false labor. You may continue to have Braxton Hicks contractions until you go into true labor. °How to tell the difference between true labor and false labor °True labor °· Contractions last 30-70 seconds. °· Contractions become very regular. °· Discomfort is usually felt in the top of the uterus, and it spreads to the lower abdomen and low back. °· Contractions do not go away with walking. °· Contractions usually become more intense and increase in frequency. °· The cervix dilates and gets thinner. °False labor °· Contractions are usually shorter and not as strong as true labor contractions. °· Contractions are usually irregular. °· Contractions  are often felt in the front of the lower abdomen and in the groin. °· Contractions may go away when you walk around or change positions while lying down. °· Contractions get weaker and are shorter-lasting as time goes on. °· The cervix usually does not dilate or become thin. °Follow these instructions at home: °· Take over-the-counter and prescription medicines only as told by your health care provider. °· Keep up with your usual exercises and follow other instructions from your health care provider. °· Eat and drink lightly if you think you are going into labor. °· If Braxton Hicks contractions are making you uncomfortable: °? Change your position from lying down or resting to walking, or change from walking to resting. °? Sit and rest in a tub of warm water. °? Drink enough fluid to keep your urine pale yellow. Dehydration may cause these contractions. °? Do slow and deep breathing several times an hour. °· Keep all follow-up prenatal visits as told by your health care provider. This is important. °Contact a health care provider if: °· You have a fever. °· You have continuous pain in your abdomen. °Get help right away if: °· Your contractions become stronger, more regular, and closer together. °· You have fluid leaking or gushing from your vagina. °· You pass blood-tinged mucus (bloody show). °· You have bleeding from your vagina. °· You have low back pain that you never had before. °· You feel your baby’s head pushing down and causing pelvic pressure. °· Your baby is not moving inside you as much as it used to. °Summary °· Contractions that occur before labor are called Braxton   Hicks contractions, false labor, or practice contractions. °· Braxton Hicks contractions are usually shorter, weaker, farther apart, and less regular than true labor contractions. True labor contractions usually become progressively stronger and regular and they become more frequent. °· Manage discomfort from Braxton Hicks contractions by  changing position, resting in a warm bath, drinking plenty of water, or practicing deep breathing. °This information is not intended to replace advice given to you by your health care provider. Make sure you discuss any questions you have with your health care provider. °Document Released: 09/09/2016 Document Revised: 09/09/2016 Document Reviewed: 09/09/2016 °Elsevier Interactive Patient Education © 2018 [employer] ° °Safe Medications in Pregnancy  ° °Acne: °Benzoyl Peroxide °Salicylic Acid ° °Backache/Headache: °Tylenol: 2 regular strength every 4 hours OR °             2 Extra strength every 6 hours ° °Colds/Coughs/Allergies: °Benadryl (alcohol free) 25 mg every 6 hours as needed °Breath right strips °Claritin °Cepacol throat lozenges °Chloraseptic throat spray °Cold-Eeze- up to three times per day °Cough drops, alcohol free °Flonase (by prescription only) °Guaifenesin °Mucinex °Robitussin DM (plain only, alcohol free) °Saline nasal spray/drops °Sudafed (pseudoephedrine) & Actifed ** use only after [redacted] weeks gestation and if you do not have high blood pressure °Tylenol °Vicks Vaporub °Zinc lozenges °Zyrtec  ° °Constipation: °Colace °Ducolax suppositories °Fleet enema °Glycerin suppositories °Metamucil °Milk of magnesia °Miralax °Senokot °Smooth move tea ° °Diarrhea: °Kaopectate °Imodium A-D ° °*NO pepto Bismol ° °Hemorrhoids: °Anusol °Anusol HC °Preparation H °Tucks ° °Indigestion: °Tums °Maalox °Mylanta °Zantac  °Pepcid ° °Insomnia: °Benadryl (alcohol free) 25mg every 6 hours as needed °Tylenol PM °Unisom, no Gelcaps ° °Leg Cramps: °Tums °MagGel ° °Nausea/Vomiting:  °Bonine °Dramamine °Emetrol °Ginger extract °Sea bands °Meclizine  °Nausea medication to take during pregnancy:  °Unisom (doxylamine succinate 25 mg tablets) Take one tablet daily at bedtime. If symptoms are not adequately controlled, the dose can be increased to a maximum recommended dose of two tablets daily (1/2 tablet in the morning, 1/2 tablet  mid-afternoon and one at bedtime). °Vitamin B6 100mg tablets. Take one tablet twice a day (up to 200 mg per day). ° °Skin Rashes: °Aveeno products °Benadryl cream or 25mg every 6 hours as needed °Calamine Lotion °1% cortisone cream ° °Yeast infection: °Gyne-lotrimin 7 °Monistat 7 ° ° °**If taking multiple medications, please check labels to avoid duplicating the same active ingredients °**take medication as directed on the label °** Do not exceed 4000 mg of tylenol in 24 hours °**Do not take medications that contain aspirin or ibuprofen ° ° ° ° °

## 2018-03-14 NOTE — MAU Provider Note (Signed)
History     CSN: [id number]  Arrival date and time: 03/14/18 4098   First Provider Initiated Contact with Patient 03/14/18 2020      Chief Complaint  Patient presents with  . Contractions   HPI Robin Cochran is a 32 y.o. G3P2002 at [redacted]w[redacted]d who presents with contractions. She states they started this afternoon and are every 10-15 minutes. She rates the pain a 4/10. Denies leaking or bleeding. Reports normal fetal movement.   OB History    Gravida  3   Para  2   Term  2   Preterm      AB      Living  2     SAB      TAB      Ectopic      Multiple      Live Births  2           Past Medical History:  Diagnosis Date  . Anemia     Past Surgical History:  Procedure Laterality Date  . CESAREAN SECTION    . TONSILLECTOMY      Family History  Problem Relation Age of Onset  . Arthritis Mother   . Hypertension Father   . Hypertension Paternal Grandmother     Social History   Tobacco Use  . Smoking status: Former Smoker    Types: E-cigarettes    Last attempt to quit: 05/11/2011    Years since quitting: 6.8  . Smokeless tobacco: Never Used  . Tobacco comment: pt states, "I vape sometimes."  Substance Use Topics  . Alcohol use: Yes  . Drug use: No    Allergies: No Known Allergies  Medications Prior to Admission  Medication Sig Dispense Refill Last Dose  . ferrous sulfate 325 (65 FE) MG tablet Take 1 tablet (325 mg total) by mouth 2 (two) times daily with a meal. 60 tablet 5 Past Month at Unknown time  . Prenatal Vit-Fe Phos-FA-Omega (VITAFOL GUMMIES) 3.33-0.333-34.8 MG CHEW Chew 3 tablets by mouth daily before breakfast. 90 tablet 11 03/14/2018 at Unknown time  . promethazine (PHENERGAN) 12.5 MG tablet Take 1-2 tablets (12.5-25 mg total) by mouth at bedtime as needed for nausea or vomiting. 30 tablet 0 03/13/2018 at Unknown time  . Elastic Bandages & Supports (COMFORT FIT MATERNITY SUPP SM) MISC Wear as directed. 1 each 0   . hydrocortisone-pramoxine  (PROCTOFOAM HC) rectal foam Place 1 applicator rectally 2 (two) times daily. 10 g 0 Unknown at Unknown time  . metoCLOPramide (REGLAN) 10 MG tablet Take 1 tablet (10 mg total) by mouth 3 (three) times daily before meals. 90 tablet 0 Unknown at Unknown time  . omeprazole (PRILOSEC) 20 MG capsule Take 1 capsule (20 mg total) by mouth 2 (two) times daily before a meal. 60 capsule 5 Unknown at Unknown time  . polyethylene glycol (MIRALAX) packet Take 17 g by mouth daily. 14 each 0 Unknown at Unknown time    Review of Systems  Constitutional: Negative.  Negative for fatigue and fever.  HENT: Negative.   Respiratory: Negative.  Negative for shortness of breath.   Cardiovascular: Negative.  Negative for chest pain.  Gastrointestinal: Positive for abdominal pain. Negative for constipation, diarrhea, nausea and vomiting.  Genitourinary: Negative.  Negative for dysuria, vaginal bleeding and vaginal discharge.  Neurological: Negative.  Negative for dizziness and headaches.   Physical Exam   Blood pressure 132/75, pulse 74, temperature 98.4 F (36.9 C), temperature source Oral, resp. rate 18, height 5\' 6"  (  1.676 m), weight 102.5 kg, last menstrual period 06/06/2017, SpO2 99 %.  Physical Exam  Nursing note and vitals reviewed. Constitutional: She is oriented to person, place, and time. She appears well-developed and well-nourished. No distress.  HENT:  Head: Normocephalic.  Eyes: Pupils are equal, round, and reactive to light.  Cardiovascular: Normal rate, regular rhythm and normal heart sounds.  Respiratory: Effort normal and breath sounds normal. No respiratory distress.  GI: Soft. Bowel sounds are normal. She exhibits no distension. There is no tenderness.  Neurological: She is alert and oriented to person, place, and time.  Skin: Skin is warm and dry.  Psychiatric: She has a normal mood and affect. Her behavior is normal. Judgment and thought content normal.   Fetal Tracing:  Baseline:  150 Variability: moderate Accels: 15x15 Decels: one variable  Toco: 4-10 minutes  Dilation: Closed Effacement (%): Thick Exam by:: Ma Hillock, CNM  MAU Course  Procedures Results for orders placed or performed during the hospital encounter of 03/14/18 (from the past 24 hour(s))  Urinalysis, Routine w reflex microscopic     Status: Abnormal   Collection Time: 03/14/18  8:12 PM  Result Value Ref Range   Color, Urine YELLOW YELLOW   APPearance HAZY (A) CLEAR   Specific Gravity, Urine 1.023 1.005 - 1.030   pH 6.0 5.0 - 8.0   Glucose, UA NEGATIVE NEGATIVE mg/dL   Hgb urine dipstick NEGATIVE NEGATIVE   Bilirubin Urine NEGATIVE NEGATIVE   Ketones, ur NEGATIVE NEGATIVE mg/dL   Protein, ur NEGATIVE NEGATIVE mg/dL   Nitrite NEGATIVE NEGATIVE   Leukocytes, UA NEGATIVE NEGATIVE   MDM UA Offered to recheck patient in 1.5-2 hours and patient declined. Unlikely to be preterm labor at this time  Consulted with Dr. Adrian Blackwater to review FHR tracing- ok to discharge patient home  Assessment and Plan   1. Braxton Hicks contractions   2. [redacted] weeks gestation of pregnancy    -Discharge home -Preterm labor precautions discussed -Patient advised to follow-up with [facility] as scheduled for prenatal care -Patient may return to MAU as needed or if her condition were to change or worsen   Rolm Bookbinder CNM 03/14/2018, 8:21 PM

## 2018-03-15 ENCOUNTER — Ambulatory Visit (INDEPENDENT_AMBULATORY_CARE_PROVIDER_SITE_OTHER): Payer: Medicaid Other | Admitting: Obstetrics

## 2018-03-15 ENCOUNTER — Encounter: Payer: Self-pay | Admitting: Obstetrics

## 2018-03-15 VITALS — BP 129/82 | HR 78 | Wt 224.0 lb

## 2018-03-15 DIAGNOSIS — Z3490 Encounter for supervision of normal pregnancy, unspecified, unspecified trimester: Secondary | ICD-10-CM

## 2018-03-15 DIAGNOSIS — Z3483 Encounter for supervision of other normal pregnancy, third trimester: Secondary | ICD-10-CM

## 2018-03-15 DIAGNOSIS — Z3009 Encounter for other general counseling and advice on contraception: Secondary | ICD-10-CM

## 2018-03-15 NOTE — Progress Notes (Addendum)
Subjective:  Robin Cochran is a 32 y.o. G3P2002 at [redacted]w[redacted]d being seen today for ongoing prenatal care.  She is currently monitored for the following issues for this low-risk pregnancy and has Previous cesarean section; Supervision of other normal pregnancy; GBS carrier; Asymptomatic bacteriuria in pregnancy; Unspecified vitamin D deficiency; Encounter for supervision of normal pregnancy, unspecified, unspecified trimester; and Abnormal antibody  on their problem list.  Patient reports no complaints.  Contractions: Irregular. Vag. Bleeding: None.  Movement: Present. Denies leaking of fluid.   The following portions of the patient's history were reviewed and updated as appropriate: allergies, current medications, past family history, past medical history, past social history, past surgical history and problem list. Problem list updated.  Objective:   Vitals:   03/15/18 1120  BP: 129/82  Pulse: 78  Weight: 224 lb (101.6 kg)    Fetal Status:     Movement: Present     General:  Alert, oriented and cooperative. Patient is in no acute distress.  Skin: Skin is warm and dry. No rash noted.   Cardiovascular: Normal heart rate noted  Respiratory: Normal respiratory effort, no problems with respiration noted  Abdomen: Soft, gravid, appropriate for gestational age. Pain/Pressure: Absent     Pelvic:  Cervical exam deferred        Extremities: Normal range of motion.  Edema: None  Mental Status: Normal mood and affect. Normal behavior. Normal judgment and thought content.   Urinalysis:      Assessment and Plan:  Pregnancy: G3P2002 at [redacted]w[redacted]d  1. Encounter for supervision of normal pregnancy, antepartum, unspecified gravidity  2. Evaluation regarding contraception options - wants PPTL.  Tubal Papers Signed Today.  Preterm labor symptoms and general obstetric precautions including but not limited to vaginal bleeding, contractions, leaking of fluid and fetal movement were reviewed in detail with  the patient. Please refer to After Visit Summary for other counseling recommendations.  Return in about 1 week (around 03/22/2018) for ROB.   Brock Bad, MD

## 2018-03-22 ENCOUNTER — Encounter: Payer: Medicaid Other | Admitting: Obstetrics

## 2018-03-29 ENCOUNTER — Encounter: Payer: Self-pay | Admitting: Obstetrics

## 2018-04-03 ENCOUNTER — Encounter: Payer: Self-pay | Admitting: Obstetrics

## 2018-04-03 ENCOUNTER — Ambulatory Visit (INDEPENDENT_AMBULATORY_CARE_PROVIDER_SITE_OTHER): Payer: Medicaid Other | Admitting: Obstetrics

## 2018-04-03 VITALS — BP 127/83 | HR 88 | Wt 230.5 lb

## 2018-04-03 DIAGNOSIS — Z3490 Encounter for supervision of normal pregnancy, unspecified, unspecified trimester: Secondary | ICD-10-CM

## 2018-04-03 DIAGNOSIS — Z3493 Encounter for supervision of normal pregnancy, unspecified, third trimester: Secondary | ICD-10-CM

## 2018-04-03 NOTE — Progress Notes (Signed)
Subjective:  Robin StarchKimberly Cochran is a 32 y.o. G3P2002 at 2444w3d being seen today for ongoing prenatal care.  She is currently monitored for the following issues for this low-risk pregnancy and has Previous cesarean section; Supervision of other normal pregnancy; GBS carrier; Asymptomatic bacteriuria in pregnancy; Unspecified vitamin D deficiency; Encounter for supervision of normal pregnancy, unspecified, unspecified trimester; and Abnormal antibody  on their problem list.  Patient reports no complaints.  Contractions: Irregular. Vag. Bleeding: None.  Movement: Present. Denies leaking of fluid.   The following portions of the patient's history were reviewed and updated as appropriate: allergies, current medications, past family history, past medical history, past social history, past surgical history and problem list. Problem list updated.  Objective:   Vitals:   04/03/18 1335  BP: 127/83  Pulse: 88  Weight: 230 lb 8 oz (104.6 kg)    Fetal Status:     Movement: Present     General:  Alert, oriented and cooperative. Patient is in no acute distress.  Skin: Skin is warm and dry. No rash noted.   Cardiovascular: Normal heart rate noted  Respiratory: Normal respiratory effort, no problems with respiration noted  Abdomen: Soft, gravid, appropriate for gestational age. Pain/Pressure: Present     Pelvic:  Cervical exam deferred        Extremities: Normal range of motion.  Edema: Trace  Mental Status: Normal mood and affect. Normal behavior. Normal judgment and thought content.   Urinalysis:      Assessment and Plan:  Pregnancy: G3P2002 at 2544w3d  1. Encounter for supervision of normal pregnancy, antepartum, unspecified gravidity   Term labor symptoms and general obstetric precautions including but not limited to vaginal bleeding, contractions, leaking of fluid and fetal movement were reviewed in detail with the patient. Please refer to After Visit Summary for other counseling recommendations.   Return in about 1 week (around 04/10/2018) for ROB.   Brock BadHarper, Aryel Edelen A, MD

## 2018-04-03 NOTE — Addendum Note (Signed)
Addended by: Dalphine HandingGARDNER, Sosaia Pittinger L on: 04/03/2018 04:16 PM   Modules accepted: Orders

## 2018-04-04 ENCOUNTER — Other Ambulatory Visit: Payer: Self-pay

## 2018-04-04 ENCOUNTER — Other Ambulatory Visit (HOSPITAL_COMMUNITY)
Admission: RE | Admit: 2018-04-04 | Discharge: 2018-04-04 | Disposition: A | Discharge status: Home / Self Care | Payer: Medicaid Other | Source: Ambulatory Visit | Attending: Obstetrics | Admitting: Obstetrics

## 2018-04-04 DIAGNOSIS — Z3490 Encounter for supervision of normal pregnancy, unspecified, unspecified trimester: Secondary | ICD-10-CM

## 2018-04-04 LAB — OB RESULTS CONSOLE GC/CHLAMYDIA: Gonorrhea: NEGATIVE

## 2018-04-05 LAB — STREP GP B NAA: STREP GROUP B AG: POSITIVE — AB

## 2018-04-05 LAB — CERVICOVAGINAL ANCILLARY ONLY
Chlamydia: NEGATIVE
Neisseria Gonorrhea: NEGATIVE

## 2018-04-11 ENCOUNTER — Encounter: Payer: Medicaid Other | Admitting: Obstetrics

## 2018-04-13 ENCOUNTER — Ambulatory Visit (INDEPENDENT_AMBULATORY_CARE_PROVIDER_SITE_OTHER): Payer: Medicaid Other | Admitting: Certified Nurse Midwife

## 2018-04-13 ENCOUNTER — Encounter: Payer: Self-pay | Admitting: Certified Nurse Midwife

## 2018-04-13 ENCOUNTER — Encounter: Payer: Self-pay | Admitting: Obstetrics

## 2018-04-13 VITALS — BP 134/84 | HR 73 | Wt 231.0 lb

## 2018-04-13 DIAGNOSIS — Z3480 Encounter for supervision of other normal pregnancy, unspecified trimester: Secondary | ICD-10-CM

## 2018-04-13 DIAGNOSIS — Z3A39 39 weeks gestation of pregnancy: Secondary | ICD-10-CM

## 2018-04-13 DIAGNOSIS — Z3483 Encounter for supervision of other normal pregnancy, third trimester: Secondary | ICD-10-CM

## 2018-04-13 NOTE — Progress Notes (Signed)
   PRENATAL VISIT NOTE  Subjective:  Robin Cochran is a 32 y.o. G3P2002 at 3151w6d being seen today for ongoing prenatal care.  She is currently monitored for the following issues for this low-risk pregnancy and has Previous cesarean section; Supervision of other normal pregnancy; GBS carrier; Asymptomatic bacteriuria in pregnancy; Unspecified vitamin D deficiency; Encounter for supervision of normal pregnancy, unspecified, unspecified trimester; and Abnormal antibody  on their problem list.  Patient reports occasional contractions.  Contractions: Irregular. Vag. Bleeding: None.  Movement: Present. Denies leaking of fluid.   The following portions of the patient's history were reviewed and updated as appropriate: allergies, current medications, past family history, past medical history, past social history, past surgical history and problem list. Problem list updated.  Objective:   Vitals:   04/13/18 1508  BP: 134/84  Pulse: 73  Weight: 231 lb (104.8 kg)    Fetal Status: Fetal Heart Rate (bpm): 140 Fundal Height: 38 cm Movement: Present  Presentation: Vertex  General:  Alert, oriented and cooperative. Patient is in no acute distress.  Skin: Skin is warm and dry. No rash noted.   Cardiovascular: Normal heart rate noted  Respiratory: Normal respiratory effort, no problems with respiration noted  Abdomen: Soft, gravid, appropriate for gestational age.  Pain/Pressure: Present     Pelvic: Cervical exam performed Dilation: 1 Effacement (%): Thick Station: -3  Extremities: Normal range of motion.     Mental Status: Normal mood and affect. Normal behavior. Normal judgment and thought content.   Assessment and Plan:  Pregnancy: G3P2002 at 8251w6d  1. Supervision of other normal pregnancy, antepartum - Patient doing well no complaints  - Anticipatory guidance on upcoming appointments and post dates pregnancy antenatal screening - Discussed IOL method options from cytotec, FB to pitocin.  Offered patient outpatient FB insertion on 12/12, patient agrees to plan of care  - BPP scheduled for Tuesday 12/10 with ROB/NST with OP FB placement scheduled for 12/12 - IOL scheduled for 12/13 @0730 , orders for admission placed  - US MFM FETAL BPP W/NONSTRESS; Future  Term labor symptoms and general obstetric precautions including but not limited to vaginal bleeding, contractions, leaking of fluid and fetal movement were reviewed in detail with the patient. Please refer to After Visit Summary for other counseling recommendations.  Return in about 5 days (around 04/18/2018) for NST/AFI, ROB.  Future Appointments  Date Time Provider Department Center  04/18/2018 12:45 PM [facility] US 2 [facility]  04/20/2018  2:30 PM Sharyon Cableogers, Sandrine Bloodsworth C, CNM [facility] None  04/21/2018  7:30 AM [facility] ROOM [facility] None    Sharyon CableVeronica C Danett Palazzo, CNM

## 2018-04-13 NOTE — Patient Instructions (Signed)
Labor Induction Labor induction is when steps are taken to cause a pregnant woman to begin the labor process. Most women go into labor on their own between 37 weeks and 42 weeks of the pregnancy. When this does not happen or when there is a medical need, methods may be used to induce labor. Labor induction causes a pregnant woman's uterus to contract. It also causes the cervix to soften (ripen), open (dilate), and thin out (efface). Usually, labor is not induced before 39 weeks of the pregnancy unless there is a problem with the baby or mother. Before inducing labor, your health care provider will consider a number of factors, including the following:  The medical condition of you and the baby.  How many weeks along you are.  The status of the baby's lung maturity.  The condition of the cervix.  The position of the baby. What are the reasons for labor induction? Labor may be induced for the following reasons:  The health of the baby or mother is at risk.  The pregnancy is overdue by 1 week or more.  The water breaks but labor does not start on its own.  The mother has a health condition or serious illness, such as high blood pressure, infection, placental abruption, or diabetes.  The amniotic fluid amounts are low around the baby.  The baby is distressed. Convenience or wanting the baby to be born on a certain date is not a reason for inducing labor. What methods are used for labor induction? Several methods of labor induction may be used, such as:  Prostaglandin medicine. This medicine causes the cervix to dilate and ripen. The medicine will also start contractions. It can be taken by mouth or by inserting a suppository into the vagina.  Inserting a thin tube (catheter) with a balloon on the end into the vagina to dilate the cervix. Once inserted, the balloon is expanded with water, which causes the cervix to open.  Stripping the membranes. Your health care provider separates  amniotic sac tissue from the cervix, causing the cervix to be stretched and causing the release of a hormone called progesterone. This may cause the uterus to contract. It is often done during an office visit. You will be sent home to wait for the contractions to begin. You will then come in for an induction.  Breaking the water. Your health care provider makes a hole in the amniotic sac using a small instrument. Once the amniotic sac breaks, contractions should begin. This may still take hours to see an effect.  Medicine to trigger or strengthen contractions. This medicine is given through an IV access tube inserted into a vein in your arm. All of the methods of induction, besides stripping the membranes, will be done in the hospital. Induction is done in the hospital so that you and the baby can be carefully monitored. How long does it take for labor to be induced? Some inductions can take up to 2-3 days. Depending on the cervix, it usually takes less time. It takes longer when you are induced early in the pregnancy or if this is your first pregnancy. If a mother is still pregnant and the induction has been going on for 2-3 days, either the mother will be sent home or a cesarean delivery will be needed. What are the risks associated with labor induction? Some of the risks of induction include:  Changes in fetal heart rate, such as too high, too low, or erratic.  Fetal distress.    Chance of infection for the mother and baby.  Increased chance of having a cesarean delivery.  Breaking off (abruption) of the placenta from the uterus (rare).  Uterine rupture (very rare). When induction is needed for medical reasons, the benefits of induction may outweigh the risks. What are some reasons for not inducing labor? Labor induction should not be done if:  It is shown that your baby does not tolerate labor.  You have had previous surgeries on your uterus, such as a myomectomy or the removal of  fibroids.  Your placenta lies very low in the uterus and blocks the opening of the cervix (placenta previa).  Your baby is not in a head-down position.  The umbilical cord drops down into the birth canal in front of the baby. This could cut off the baby's blood and oxygen supply.  You have had a previous cesarean delivery.  There are unusual circumstances, such as the baby being extremely premature. This information is not intended to replace advice given to you by your health care provider. Make sure you discuss any questions you have with your health care provider. Document Released: 09/15/2006 Document Revised: 10/02/2015 Document Reviewed: 11/23/2012 Elsevier Interactive Patient Education  2017 [employer]  

## 2018-04-14 ENCOUNTER — Telehealth (HOSPITAL_COMMUNITY): Payer: Self-pay | Admitting: *Deleted

## 2018-04-14 ENCOUNTER — Encounter (HOSPITAL_COMMUNITY): Payer: Self-pay | Admitting: *Deleted

## 2018-04-14 NOTE — Telephone Encounter (Signed)
Preadmission screen  

## 2018-04-18 ENCOUNTER — Inpatient Hospital Stay (HOSPITAL_COMMUNITY)
Admission: AD | Admit: 2018-04-18 | Discharge: 2018-04-21 | DRG: 798 | Disposition: A | Discharge status: Home / Self Care | Payer: Medicaid Other | Attending: Family Medicine | Admitting: Family Medicine

## 2018-04-18 ENCOUNTER — Encounter (HOSPITAL_COMMUNITY): Payer: Self-pay

## 2018-04-18 ENCOUNTER — Ambulatory Visit (HOSPITAL_BASED_OUTPATIENT_CLINIC_OR_DEPARTMENT_OTHER)
Admission: RE | Admit: 2018-04-18 | Discharge: 2018-04-18 | Disposition: A | Discharge status: Home / Self Care | Payer: Medicaid Other | Source: Ambulatory Visit | Attending: Certified Nurse Midwife | Admitting: Certified Nurse Midwife

## 2018-04-18 ENCOUNTER — Ambulatory Visit (HOSPITAL_COMMUNITY)
Admission: RE | Admit: 2018-04-18 | Discharge: 2018-04-18 | Disposition: A | Discharge status: Home / Self Care | Payer: Medicaid Other | Source: Ambulatory Visit | Attending: Certified Nurse Midwife | Admitting: Certified Nurse Midwife

## 2018-04-18 VITALS — BP 136/62 | HR 82

## 2018-04-18 DIAGNOSIS — O283 Abnormal ultrasonic finding on antenatal screening of mother: Secondary | ICD-10-CM

## 2018-04-18 DIAGNOSIS — Z98891 History of uterine scar from previous surgery: Secondary | ICD-10-CM

## 2018-04-18 DIAGNOSIS — O34219 Maternal care for unspecified type scar from previous cesarean delivery: Secondary | ICD-10-CM | POA: Diagnosis present

## 2018-04-18 DIAGNOSIS — Z3A40 40 weeks gestation of pregnancy: Secondary | ICD-10-CM

## 2018-04-18 DIAGNOSIS — O26893 Other specified pregnancy related conditions, third trimester: Secondary | ICD-10-CM | POA: Diagnosis present

## 2018-04-18 DIAGNOSIS — Z22330 Carrier of Group B streptococcus: Secondary | ICD-10-CM

## 2018-04-18 DIAGNOSIS — O480 Post-term pregnancy: Secondary | ICD-10-CM | POA: Diagnosis not present

## 2018-04-18 DIAGNOSIS — Z302 Encounter for sterilization: Secondary | ICD-10-CM

## 2018-04-18 DIAGNOSIS — O99824 Streptococcus B carrier state complicating childbirth: Secondary | ICD-10-CM | POA: Diagnosis present

## 2018-04-18 DIAGNOSIS — R030 Elevated blood-pressure reading, without diagnosis of hypertension: Secondary | ICD-10-CM | POA: Diagnosis present

## 2018-04-18 DIAGNOSIS — Z3480 Encounter for supervision of other normal pregnancy, unspecified trimester: Secondary | ICD-10-CM

## 2018-04-18 DIAGNOSIS — D649 Anemia, unspecified: Secondary | ICD-10-CM | POA: Diagnosis present

## 2018-04-18 DIAGNOSIS — Z87891 Personal history of nicotine dependence: Secondary | ICD-10-CM

## 2018-04-18 DIAGNOSIS — O9902 Anemia complicating childbirth: Secondary | ICD-10-CM | POA: Diagnosis present

## 2018-04-18 DIAGNOSIS — Z362 Encounter for other antenatal screening follow-up: Secondary | ICD-10-CM

## 2018-04-18 LAB — COMPREHENSIVE METABOLIC PANEL
ALT: 12 U/L (ref 0–44)
AST: 20 U/L (ref 15–41)
Albumin: 2.9 g/dL — ABNORMAL LOW (ref 3.5–5.0)
Alkaline Phosphatase: 160 U/L — ABNORMAL HIGH (ref 38–126)
Anion gap: 8 (ref 5–15)
BUN: <5 mg/dL — ABNORMAL LOW (ref 6–20)
CO2: 22 mmol/L (ref 22–32)
Calcium: 8.7 mg/dL — ABNORMAL LOW (ref 8.9–10.3)
Chloride: 105 mmol/L (ref 98–111)
Creatinine, Ser: 0.52 mg/dL (ref 0.44–1.00)
GFR calc Af Amer: >60 mL/min (ref >60)
GFR calc non Af Amer: >60 mL/min (ref >60)
Glucose, Bld: 71 mg/dL (ref 70–99)
Potassium: 3.7 mmol/L (ref 3.5–5.1)
Sodium: 135 mmol/L (ref 135–145)
TOTAL PROTEIN: 6 g/dL — AB (ref 6.5–8.1)
Total Bilirubin: 0.3 mg/dL (ref 0.3–1.2)

## 2018-04-18 LAB — CBC
HCT: 30.4 % — ABNORMAL LOW (ref 36.0–46.0)
Hemoglobin: 10.2 g/dL — ABNORMAL LOW (ref 12.0–15.0)
MCH: 29.7 pg (ref 26.0–34.0)
MCHC: 33.6 g/dL (ref 30.0–36.0)
MCV: 88.6 fL (ref 80.0–100.0)
Platelets: 254 10*3/uL (ref 150–400)
RBC: 3.43 MIL/uL — ABNORMAL LOW (ref 3.87–5.11)
RDW: 14.5 % (ref 11.5–15.5)
WBC: 12.7 10*3/uL — ABNORMAL HIGH (ref 4.0–10.5)
nRBC: 0 % (ref 0.0–0.2)

## 2018-04-18 LAB — PROTEIN / CREATININE RATIO, URINE
Creatinine, Urine: 47 mg/dL
Protein Creatinine Ratio: 0.3 mg/mg{Cre} — ABNORMAL HIGH (ref 0.00–0.15)
Total Protein, Urine: 14 mg/dL

## 2018-04-18 LAB — TYPE AND SCREEN
ABO/RH(D): O POS
Antibody Screen: NEGATIVE

## 2018-04-18 LAB — ABO/RH: ABO/RH(D): O POS

## 2018-04-18 MED ORDER — LIDOCAINE HCL (PF) 1 % IJ SOLN
30.0000 mL | INTRAMUSCULAR | Status: AC | PRN
  Administered 2018-04-19: 30 mL via SUBCUTANEOUS
  Filled 2018-04-18: qty 30

## 2018-04-18 MED ORDER — ACETAMINOPHEN 325 MG PO TABS: 650.0000 mg | ORAL_TABLET | ORAL | Status: DC | PRN

## 2018-04-18 MED ORDER — LACTATED RINGERS IV SOLN
500.0000 mL | Freq: Once | INTRAVENOUS | Status: AC
  Administered 2018-04-19: 500 mL via INTRAVENOUS

## 2018-04-18 MED ORDER — MISOPROSTOL 25 MCG QUARTER TABLET
25.0000 ug | ORAL_TABLET | ORAL | Status: DC | PRN
  Filled 2018-04-18: qty 1

## 2018-04-18 MED ORDER — PHENYLEPHRINE 40 MCG/ML (10ML) SYRINGE FOR IV PUSH (FOR BLOOD PRESSURE SUPPORT)
80.0000 ug | PREFILLED_SYRINGE | INTRAVENOUS | Status: DC | PRN
  Filled 2018-04-18: qty 10

## 2018-04-18 MED ORDER — OXYCODONE-ACETAMINOPHEN 5-325 MG PO TABS: 2.0000 | ORAL_TABLET | ORAL | Status: DC | PRN

## 2018-04-18 MED ORDER — LACTATED RINGERS IV SOLN
INTRAVENOUS | Status: DC
  Administered 2018-04-18 – 2018-04-19 (×3): via INTRAVENOUS

## 2018-04-18 MED ORDER — LACTATED RINGERS IV SOLN
500.0000 mL | INTRAVENOUS | Status: DC | PRN
  Administered 2018-04-19: 500 mL via INTRAVENOUS

## 2018-04-18 MED ORDER — FENTANYL 2.5 MCG/ML BUPIVACAINE 1/10 % EPIDURAL INFUSION (WH - ANES)
14.0000 mL/h | INTRAMUSCULAR | Status: DC | PRN
  Administered 2018-04-19: 14 mL/h via EPIDURAL
  Filled 2018-04-18: qty 100

## 2018-04-18 MED ORDER — SODIUM CHLORIDE 0.9 % IV SOLN
5.0000 10*6.[IU] | Freq: Once | INTRAVENOUS | Status: AC
  Administered 2018-04-18: 5 10*6.[IU] via INTRAVENOUS
  Filled 2018-04-18: qty 5

## 2018-04-18 MED ORDER — FENTANYL CITRATE (PF) 100 MCG/2ML IJ SOLN
50.0000 ug | INTRAMUSCULAR | Status: DC | PRN
  Administered 2018-04-18: 100 ug via INTRAVENOUS
  Filled 2018-04-18: qty 2

## 2018-04-18 MED ORDER — SOD CITRATE-CITRIC ACID 500-334 MG/5ML PO SOLN: 30.0000 mL | ORAL | Status: DC | PRN

## 2018-04-18 MED ORDER — PENICILLIN G 3 MILLION UNITS IVPB - SIMPLE MED
3.0000 10*6.[IU] | INTRAVENOUS | Status: DC
  Administered 2018-04-18 – 2018-04-19 (×2): 3 10*6.[IU] via INTRAVENOUS
  Filled 2018-04-18 (×4): qty 100

## 2018-04-18 MED ORDER — ONDANSETRON HCL 4 MG/2ML IJ SOLN: 4.0000 mg | Freq: Four times a day (QID) | INTRAMUSCULAR | Status: DC | PRN

## 2018-04-18 MED ORDER — PHENYLEPHRINE 40 MCG/ML (10ML) SYRINGE FOR IV PUSH (FOR BLOOD PRESSURE SUPPORT)
80.0000 ug | PREFILLED_SYRINGE | INTRAVENOUS | Status: DC | PRN
  Filled 2018-04-18 (×2): qty 10

## 2018-04-18 MED ORDER — OXYTOCIN 40 UNITS IN LACTATED RINGERS INFUSION - SIMPLE MED
2.5000 [IU]/h | INTRAVENOUS | Status: DC
  Administered 2018-04-19: 2.5 [IU]/h via INTRAVENOUS

## 2018-04-18 MED ORDER — OXYTOCIN BOLUS FROM INFUSION
500.0000 mL | Freq: Once | INTRAVENOUS | Status: AC
  Administered 2018-04-19: 500 mL via INTRAVENOUS

## 2018-04-18 MED ORDER — EPHEDRINE 5 MG/ML INJ
10.0000 mg | INTRAVENOUS | Status: DC | PRN
  Filled 2018-04-18: qty 2

## 2018-04-18 MED ORDER — DIPHENHYDRAMINE HCL 50 MG/ML IJ SOLN: 12.5000 mg | INTRAMUSCULAR | Status: DC | PRN

## 2018-04-18 MED ORDER — OXYTOCIN 40 UNITS IN LACTATED RINGERS INFUSION - SIMPLE MED
1.0000 m[IU]/min | INTRAVENOUS | Status: DC
  Administered 2018-04-18: 2 m[IU]/min via INTRAVENOUS
  Filled 2018-04-18: qty 1000

## 2018-04-18 MED ORDER — TERBUTALINE SULFATE 1 MG/ML IJ SOLN
0.2500 mg | Freq: Once | INTRAMUSCULAR | Status: DC | PRN
  Filled 2018-04-18: qty 1

## 2018-04-18 MED ORDER — OXYCODONE-ACETAMINOPHEN 5-325 MG PO TABS: 1.0000 | ORAL_TABLET | ORAL | Status: DC | PRN

## 2018-04-18 NOTE — Progress Notes (Signed)
LABOR PROGRESS NOTE  Robin StarchKimberly Messer is a 32 y.o. G3P2002 at 10948w4d  admitted for IOL for NRNST/BPP 6/10  Subjective: Foley bulb out Not feeling uncomfortable yet Not ready for any pain meds  Objective: BP (!) 146/90   Pulse 98   Temp 98.3 F (36.8 C) (Oral)   Resp 16   LMP 06/06/2017 (Approximate)  or  Vitals:   04/18/18 1510 04/18/18 1645 04/18/18 1914  BP: 140/82 127/90 (!) 146/90  Pulse: 77 73 98  Resp: 16 16 16   Temp: 98.3 F (36.8 C) 98.5 F (36.9 C) 98.3 F (36.8 C)  TempSrc: Oral Oral Oral    Dilation: 4 Effacement (%): 50 Cervical Position: Posterior Station: -3 Presentation: Vertex Exam by:: B. Bumgarner, RN FHT: baseline rate 120s, moderate varibility, + acel, no decels Toco: irregular contractions  Labs: Lab Results  Component Value Date   WBC 12.7 (H) 04/18/2018   HGB 10.2 (L) 04/18/2018   HCT 30.4 (L) 04/18/2018   MCV 88.6 04/18/2018   PLT 254 04/18/2018    Patient Active Problem List   Diagnosis Date Noted  . Abnormal antibody  12/21/2017  . Encounter for supervision of normal pregnancy, unspecified, unspecified trimester 12/07/2017  . 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 Asymptomatic bacteriuria in pregnancy 09/22/2012  . Unspecified vitamin D deficiency 09/22/2012  . Previous cesarean section 09/18/2012  . Supervision of other normal pregnancy 09/18/2012    Assessment / Plan: 32 y.o. G3P2002 at 7448w4d here for IOL for BPP 6/10/NRSNT  Labor: start pitocin since foley out Fetal Wellbeing:  Cat 1 Pain Control:  Not ready for epidural just yet Anticipated MOD:  SVD Elevated blood pressures: CMP and UPC ordered; Platelets WNL on last check  Gwenevere AbbotNimeka Sharion Grieves, MD  OB Fellow  04/18/2018, 9:18 PM

## 2018-04-18 NOTE — Anesthesia Pain Management Evaluation Note (Signed)
  CRNA Pain Management Visit Note  Patient: Robin Cochran, 32 y.o., female  "Hello [facility]. We have an anesthesia team available at all times to provide care throughout the hospital, including epidural management and anesthesia for C-section. I don't know your plan for the delivery whether it a natural birth, water birth, IV sedation, nitrous supplementation, doula or epidural, but we want to meet your pain goals."   1.Was your pain managed to your expectations on prior hospitalizations?   Yes   2.What is your expectation for pain management during this hospitalization?     Epidural  3.How can we help you reach that goal? epidural  Record the patient's initial score and the patient's pain goal.   Pain: 0  Pain Goal: 5 [facility] wants you to be able to say your pain was always managed very well.  Gregory Barrick 04/18/2018

## 2018-04-18 NOTE — H&P (Addendum)
OBSTETRIC ADMISSION HISTORY AND PHYSICAL  Robin Cochran is a 32 y.o. female G3P2002 with IUP at [redacted]w[redacted]d presenting for IOL in the setting of BPP 6/10 for NRNST and movement. She reports +FMs. No LOF, VB, blurry vision, headaches, peripheral edema, or RUQ pain. She plans on bottlefeeding. She requests PP BTL for birth control and has matured papers.  Dating: By [place] 23w --->  Estimated Date of Delivery: 04/14/18  Sono:   @[redacted]w[redacted]d , CWD, normal anatomy, cephalic presentation, 1027g, 16%XWR Enlarged kidneys on initial anatomy scan determined to be normal variant given normal fluid levels.   Prenatal History/Complications: Prior CS 2011 for failure to descend followed by successful NSVD 2014  Past Medical History: Past Medical History:  Diagnosis Date  . Anemia     Past Surgical History: Past Surgical History:  Procedure Laterality Date  . CESAREAN SECTION    . TONSILLECTOMY      Obstetrical History: OB History    Gravida  3   Para  2   Term  2   Preterm      AB      Living  2     SAB      TAB      Ectopic      Multiple      Live Births  2           Social History: Social History   Socioeconomic History  . Marital status: Single    Spouse name: Not on file  . Number of children: 2  . Years of education: Not on file  . Highest education level: High school graduate  Occupational History  . Occupation: unemployed  Social Needs  . Financial resource strain: Not hard at all  . Food insecurity:    Worry: Never true    Inability: Never true  . Transportation needs:    Medical: No    Non-medical: No  Tobacco Use  . Smoking status: Former Smoker    Types: E-cigarettes    Last attempt to quit: 05/11/2011    Years since quitting: 6.9  . Smokeless tobacco: Never Used  . Tobacco comment: pt states, "I vape sometimes."  Substance and Sexual Activity  . Alcohol use: Yes  . Drug use: No  . Sexual activity: Yes    Partners: Male    Birth control/protection:  None  Lifestyle  . Physical activity:    Days per week: 0 days    Minutes per session: 0 min  . Stress: Only a little  Relationships  . Social connections:    Talks on phone: Three times a week    Gets together: More than three times a week    Attends religious service: Not on file    Active member of club or organization: Yes    Attends meetings of clubs or organizations: 1 to 4 times per year    Relationship status: Never married  Other Topics Concern  . Not on file  Social History Narrative  . Not on file    Family History: Family History  Problem Relation Age of Onset  . Arthritis Mother   . Hypertension Father   . Hypertension Paternal Grandmother     Allergies: No Known Allergies  Medications Prior to Admission  Medication Sig Dispense Refill Last Dose  . Elastic Bandages & Supports (COMFORT FIT MATERNITY SUPP SM) MISC Wear as directed. (Patient not taking: Reported on 04/03/2018) 1 each 0 Not Taking  . ferrous sulfate 325 (65 FE) MG tablet  Take 1 tablet (325 mg total) by mouth 2 (two) times daily with a meal. (Patient not taking: Reported on 04/18/2018) 60 tablet 5 Not Taking  . hydrocortisone-pramoxine (PROCTOFOAM HC) rectal foam Place 1 applicator rectally 2 (two) times daily. (Patient not taking: Reported on 03/15/2018) 10 g 0 Not Taking  . metoCLOPramide (REGLAN) 10 MG tablet Take 1 tablet (10 mg total) by mouth 3 (three) times daily before meals. (Patient not taking: Reported on 04/03/2018) 90 tablet 0 Not Taking  . omeprazole (PRILOSEC) 20 MG capsule Take 1 capsule (20 mg total) by mouth 2 (two) times daily before a meal. 60 capsule 5 Taking  . polyethylene glycol (MIRALAX) packet Take 17 g by mouth daily. (Patient not taking: Reported on 04/03/2018) 14 each 0 Not Taking  . Prenatal Vit-Fe Phos-FA-Omega (VITAFOL GUMMIES) 3.33-0.333-34.8 MG CHEW Chew 3 tablets by mouth daily before breakfast. 90 tablet 11 Taking  . promethazine (PHENERGAN) 12.5 MG tablet Take 1-2  tablets (12.5-25 mg total) by mouth at bedtime as needed for nausea or vomiting. 30 tablet 0 Taking     Review of Systems   All systems reviewed and negative except as stated in HPI  Last menstrual period 06/06/2017. General appearance: alert, cooperative and no distress Lungs: regular rate and effort Heart: regular rate  Abdomen: soft, non-tender Extremities: Homans sign is negative, no sign of DVT Presentation: cephalic Fetal monitoringBaseline: 140s bpm, Variability: Good {> 6 bpm), Accelerations: Reactive and Decelerations: Absent Uterine activity: Not present     Prenatal labs: ABO, Rh: O/Positive/-- (07/31 1532) Antibody: Positive, See Final Results (07/31 1532) Rubella: 2.33 (07/31 1532) RPR: Non Reactive (09/17 1100)  HBsAg: Negative (07/31 1532)  HIV: Non Reactive (09/17 1100)  GBS: Positive (11/25 1709)  2 hr GTT normal third trimester  Prenatal Transfer Tool  Maternal Diabetes: No Genetic Screening: Declined Maternal Ultrasounds/Referrals: Normal Fetal Ultrasounds or other Referrals:  None Maternal Substance Abuse:  No Significant Maternal Medications:  None Significant Maternal Lab Results: Lab values include: Group B Strep positive  No results found for this or any previous visit (from the past 24 hour(s)).  Patient Active Problem List   Diagnosis Date Noted  . Abnormal antibody  12/21/2017  . Encounter for supervision of normal pregnancy, unspecified, unspecified trimester 12/07/2017  . 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 Asymptomatic bacteriuria in pregnancy 09/22/2012  . Unspecified vitamin D deficiency 09/22/2012  . Previous cesarean section 09/18/2012  . Supervision of other normal pregnancy 09/18/2012    Assessment: Robin Cochran is a 32 y.o. G3P2002 at [redacted]w[redacted]d here for IOL in the setting of BPP 6/10 for NRNST and movement.  1. Labor: Not in active labor. 2. FWB: Reactive. 3. Pain: Not present. 4. GBS: Positive.    Plan: 1. Labor: Foley placed.   2. FWB: Continue to monitor. 3. Pain: Epidural once desired. 4. GBS: PCN ordered.  Awilda Metro, MD  04/18/2018, 3:18 PM  CNM attestation:  I have seen and examined this patient; I agree with above documentation in the resident's note.   Robin Cochran is a 32 y.o. G3P2002 here for IOL due to BPP 6/10 and term  PE: BP 127/90   Pulse 73   Temp 98.5 F (36.9 C) (Oral)   Resp 16   LMP 06/06/2017 (Approximate)  Gen: calm comfortable, NAD Resp: normal effort, no distress Abd: gravid  ROS, labs, PMH reviewed  Plan: Admit to [facility] Plan cervical foley for ripening, followed by [facility] PCN for GBS ppx Anticipate VBAC Plan  for ppBTL  Arabella MerlesKimberly D Irine Heminger CNM 04/18/2018, 7:06 PM

## 2018-04-19 ENCOUNTER — Encounter (HOSPITAL_COMMUNITY): Payer: Self-pay

## 2018-04-19 ENCOUNTER — Inpatient Hospital Stay (HOSPITAL_COMMUNITY): Payer: Medicaid Other | Admitting: Anesthesiology

## 2018-04-19 ENCOUNTER — Encounter (HOSPITAL_COMMUNITY)
Admission: AD | Disposition: A | Discharge status: Home / Self Care | Payer: Self-pay | Source: Home / Self Care | Attending: Family Medicine

## 2018-04-19 DIAGNOSIS — Z3A40 40 weeks gestation of pregnancy: Secondary | ICD-10-CM

## 2018-04-19 DIAGNOSIS — O99824 Streptococcus B carrier state complicating childbirth: Secondary | ICD-10-CM

## 2018-04-19 DIAGNOSIS — O480 Post-term pregnancy: Secondary | ICD-10-CM

## 2018-04-19 DIAGNOSIS — Z302 Encounter for sterilization: Secondary | ICD-10-CM

## 2018-04-19 HISTORY — PX: TUBAL LIGATION: SHX77

## 2018-04-19 LAB — RPR: RPR Ser Ql: NONREACTIVE

## 2018-04-19 SURGERY — LIGATION, FALLOPIAN TUBE, POSTPARTUM
Anesthesia: Epidural | Site: Abdomen | Laterality: Bilateral | Wound class: Clean Contaminated

## 2018-04-19 MED ORDER — BUPIVACAINE HCL (PF) 0.25 % IJ SOLN
INTRAMUSCULAR | Status: AC
  Filled 2018-04-19: qty 30

## 2018-04-19 MED ORDER — PHENYLEPHRINE 40 MCG/ML (10ML) SYRINGE FOR IV PUSH (FOR BLOOD PRESSURE SUPPORT)
80.0000 ug | PREFILLED_SYRINGE | INTRAVENOUS | Status: DC | PRN
  Filled 2018-04-19: qty 10

## 2018-04-19 MED ORDER — SENNOSIDES-DOCUSATE SODIUM 8.6-50 MG PO TABS
2.0000 | ORAL_TABLET | ORAL | Status: DC
  Administered 2018-04-19 – 2018-04-21 (×2): 2 via ORAL
  Filled 2018-04-19 (×2): qty 2

## 2018-04-19 MED ORDER — WITCH HAZEL-GLYCERIN EX PADS: 1.0000 "application " | MEDICATED_PAD | CUTANEOUS | Status: DC | PRN

## 2018-04-19 MED ORDER — ONDANSETRON HCL 4 MG/2ML IJ SOLN: 4.0000 mg | INTRAMUSCULAR | Status: DC | PRN

## 2018-04-19 MED ORDER — ONDANSETRON HCL 4 MG/2ML IJ SOLN
INTRAMUSCULAR | Status: DC | PRN
  Administered 2018-04-19: 4 mg via INTRAVENOUS

## 2018-04-19 MED ORDER — COCONUT OIL OIL: 1.0000 "application " | TOPICAL_OIL | Status: DC | PRN

## 2018-04-19 MED ORDER — HYDROMORPHONE HCL 1 MG/ML IJ SOLN
0.2500 mg | INTRAMUSCULAR | Status: DC | PRN
  Administered 2018-04-19: 0.5 mg via INTRAVENOUS

## 2018-04-19 MED ORDER — SIMETHICONE 80 MG PO CHEW: 80.0000 mg | CHEWABLE_TABLET | ORAL | Status: DC | PRN

## 2018-04-19 MED ORDER — LACTATED RINGERS IV SOLN: 500.0000 mL | Freq: Once | INTRAVENOUS | Status: DC

## 2018-04-19 MED ORDER — LACTATED RINGERS IV SOLN
INTRAVENOUS | Status: DC | PRN
  Administered 2018-04-19 (×2): via INTRAVENOUS

## 2018-04-19 MED ORDER — LIDOCAINE-EPINEPHRINE (PF) 2 %-1:200000 IJ SOLN
INTRAMUSCULAR | Status: DC | PRN
  Administered 2018-04-19 (×3): 5 mL via EPIDURAL

## 2018-04-19 MED ORDER — PANTOPRAZOLE SODIUM 40 MG PO TBEC
40.0000 mg | DELAYED_RELEASE_TABLET | Freq: Every day | ORAL | Status: DC
  Administered 2018-04-21: 40 mg via ORAL
  Filled 2018-04-19 (×2): qty 1

## 2018-04-19 MED ORDER — PRENATAL MULTIVITAMIN CH
1.0000 | ORAL_TABLET | Freq: Every day | ORAL | Status: DC
  Administered 2018-04-20: 1 via ORAL
  Filled 2018-04-19: qty 1

## 2018-04-19 MED ORDER — LACTATED RINGERS IV SOLN
INTRAVENOUS | Status: DC
  Administered 2018-04-19: 10 mL/h via INTRAVENOUS

## 2018-04-19 MED ORDER — KETOROLAC TROMETHAMINE 30 MG/ML IJ SOLN
INTRAMUSCULAR | Status: DC | PRN
  Administered 2018-04-19: 30 mg via INTRAVENOUS

## 2018-04-19 MED ORDER — MEASLES, MUMPS & RUBELLA VAC IJ SOLR
0.5000 mL | Freq: Once | INTRAMUSCULAR | Status: DC
  Filled 2018-04-19: qty 0.5

## 2018-04-19 MED ORDER — IBUPROFEN 600 MG PO TABS
600.0000 mg | ORAL_TABLET | Freq: Four times a day (QID) | ORAL | Status: DC
  Administered 2018-04-19 – 2018-04-20 (×5): 600 mg via ORAL
  Filled 2018-04-19 (×6): qty 1

## 2018-04-19 MED ORDER — OXYCODONE HCL 5 MG/5ML PO SOLN: 5.0000 mg | Freq: Once | ORAL | Status: DC | PRN

## 2018-04-19 MED ORDER — OXYCODONE HCL 5 MG PO TABS: 5.0000 mg | ORAL_TABLET | Freq: Once | ORAL | Status: DC | PRN

## 2018-04-19 MED ORDER — ERYTHROMYCIN 5 MG/GM OP OINT
TOPICAL_OINTMENT | OPHTHALMIC | Status: AC
  Filled 2018-04-19: qty 1

## 2018-04-19 MED ORDER — LIDOCAINE HCL (PF) 1 % IJ SOLN
INTRAMUSCULAR | Status: DC | PRN
  Administered 2018-04-19: 5 mL via EPIDURAL

## 2018-04-19 MED ORDER — EPHEDRINE 5 MG/ML INJ
10.0000 mg | INTRAVENOUS | Status: DC | PRN
  Filled 2018-04-19: qty 2

## 2018-04-19 MED ORDER — FENTANYL CITRATE (PF) 100 MCG/2ML IJ SOLN
INTRAMUSCULAR | Status: DC | PRN
  Administered 2018-04-19: 50 ug via INTRAVENOUS
  Administered 2018-04-19: 25 ug via INTRAVENOUS
  Administered 2018-04-19: 50 ug via INTRAVENOUS
  Administered 2018-04-19: 25 ug via INTRAVENOUS
  Administered 2018-04-19 (×2): 50 ug via INTRAVENOUS

## 2018-04-19 MED ORDER — FAMOTIDINE 20 MG PO TABS
40.0000 mg | ORAL_TABLET | Freq: Once | ORAL | Status: AC
  Administered 2018-04-19: 40 mg via ORAL
  Filled 2018-04-19: qty 2

## 2018-04-19 MED ORDER — DIBUCAINE 1 % RE OINT: 1.0000 "application " | TOPICAL_OINTMENT | RECTAL | Status: DC | PRN

## 2018-04-19 MED ORDER — BUPIVACAINE HCL (PF) 0.25 % IJ SOLN
INTRAMUSCULAR | Status: DC | PRN
  Administered 2018-04-19: 20 mL

## 2018-04-19 MED ORDER — BENZOCAINE-MENTHOL 20-0.5 % EX AERO
1.0000 "application " | INHALATION_SPRAY | CUTANEOUS | Status: DC | PRN
  Administered 2018-04-21: 1 via TOPICAL
  Filled 2018-04-19: qty 56

## 2018-04-19 MED ORDER — OXYCODONE HCL 5 MG PO TABS
5.0000 mg | ORAL_TABLET | ORAL | Status: DC | PRN
  Administered 2018-04-19 – 2018-04-21 (×9): 5 mg via ORAL
  Filled 2018-04-19 (×10): qty 1

## 2018-04-19 MED ORDER — SODIUM CHLORIDE 0.9 % IR SOLN
Status: DC | PRN
  Administered 2018-04-19: 1

## 2018-04-19 MED ORDER — HYDROMORPHONE HCL 1 MG/ML IJ SOLN
INTRAMUSCULAR | Status: AC
  Filled 2018-04-19: qty 0.5

## 2018-04-19 MED ORDER — ONDANSETRON HCL 4 MG PO TABS: 4.0000 mg | ORAL_TABLET | ORAL | Status: DC | PRN

## 2018-04-19 MED ORDER — ACETAMINOPHEN 325 MG PO TABS
650.0000 mg | ORAL_TABLET | ORAL | Status: DC | PRN
  Administered 2018-04-19 – 2018-04-21 (×10): 650 mg via ORAL
  Filled 2018-04-19 (×10): qty 2

## 2018-04-19 MED ORDER — TETANUS-DIPHTH-ACELL PERTUSSIS 5-2.5-18.5 LF-MCG/0.5 IM SUSP: 0.5000 mL | Freq: Once | INTRAMUSCULAR | Status: DC

## 2018-04-19 MED ORDER — DIPHENHYDRAMINE HCL 25 MG PO CAPS
25.0000 mg | ORAL_CAPSULE | Freq: Four times a day (QID) | ORAL | Status: DC | PRN
  Administered 2018-04-19: 25 mg via ORAL
  Filled 2018-04-19: qty 1

## 2018-04-19 MED ORDER — PROMETHAZINE HCL 25 MG/ML IJ SOLN: 6.2500 mg | INTRAMUSCULAR | Status: DC | PRN

## 2018-04-19 MED ORDER — METOCLOPRAMIDE HCL 10 MG PO TABS
10.0000 mg | ORAL_TABLET | Freq: Once | ORAL | Status: AC
  Administered 2018-04-19: 10 mg via ORAL
  Filled 2018-04-19: qty 1

## 2018-04-19 SURGICAL SUPPLY — 27 items
BENZOIN TINCTURE PRP APPL 2/3 (GAUZE/BANDAGES/DRESSINGS) ×2 IMPLANT
BLADE SURG 11 STRL SS (BLADE) ×2 IMPLANT
CLIP FILSHIE TUBAL LIGA STRL (Clip) ×2 IMPLANT
CLOSURE STERI STRIP 1/2 X4 (GAUZE/BANDAGES/DRESSINGS) ×2 IMPLANT
CLOTH BEACON ORANGE TIMEOUT ST (SAFETY) ×2 IMPLANT
DERMABOND ADVANCED (GAUZE/BANDAGES/DRESSINGS) ×1
DERMABOND ADVANCED .7 DNX12 (GAUZE/BANDAGES/DRESSINGS) ×1 IMPLANT
DRSG OPSITE POSTOP 3X4 (GAUZE/BANDAGES/DRESSINGS) ×2 IMPLANT
DURAPREP 26ML APPLICATOR (WOUND CARE) ×2 IMPLANT
GLOVE BIOGEL PI IND STRL 7.0 (GLOVE) ×1 IMPLANT
GLOVE BIOGEL PI IND STRL 7.5 (GLOVE) ×1 IMPLANT
GLOVE BIOGEL PI INDICATOR 7.0 (GLOVE) ×1
GLOVE BIOGEL PI INDICATOR 7.5 (GLOVE) ×1
GLOVE ECLIPSE 7.5 STRL STRAW (GLOVE) ×2 IMPLANT
GOWN STRL REUS W/TWL LRG LVL3 (GOWN DISPOSABLE) ×4 IMPLANT
NEEDLE HYPO 22GX1.5 SAFETY (NEEDLE) ×2 IMPLANT
NS IRRIG 1000ML POUR BTL (IV SOLUTION) ×2 IMPLANT
PACK ABDOMINAL MINOR (CUSTOM PROCEDURE TRAY) ×2 IMPLANT
PROTECTOR NERVE ULNAR (MISCELLANEOUS) ×2 IMPLANT
SPONGE LAP 4X18 RFD (DISPOSABLE) IMPLANT
SUT VIC AB 4-0 PS2 18 (SUTURE) ×2 IMPLANT
SUT VICRYL 0 UR6 27IN ABS (SUTURE) ×2 IMPLANT
SUT VICRYL 4-0 PS2 18IN ABS (SUTURE) ×2 IMPLANT
SYR CONTROL 10ML LL (SYRINGE) ×2 IMPLANT
TOWEL OR 17X24 6PK STRL BLUE (TOWEL DISPOSABLE) ×4 IMPLANT
TRAY FOLEY CATH SILVER 14FR (SET/KITS/TRAYS/PACK) ×2 IMPLANT
WATER STERILE IRR 1000ML POUR (IV SOLUTION) ×2 IMPLANT

## 2018-04-19 NOTE — Transfer of Care (Signed)
Immediate Anesthesia Transfer of Care Note  Patient: Robin Cochran  Procedure(s) Performed: POST PARTUM TUBAL LIGATION (Bilateral Abdomen)  Patient Location: PACU  Anesthesia Type:Epidural  Level of Consciousness: awake, alert  and oriented  Airway & Oxygen Therapy: Patient Spontanous Breathing  Post-op Assessment: Report given to RN and Post -op Vital signs reviewed and stable  Post vital signs: Reviewed and stable HR 80,RR 12, SaO2 100%, BP 119/74  Last Vitals:  Vitals Value Taken Time  BP    Temp    Pulse    Resp    SpO2      Last Pain:  Vitals:   04/19/18 1012  TempSrc: Oral  PainSc: 0-No pain         Complications: No apparent anesthesia complications

## 2018-04-19 NOTE — Anesthesia Postprocedure Evaluation (Signed)
Anesthesia Post Note  Patient: Robin Cochran  Procedure(s) Performed: AN AD HOC LABOR EPIDURAL     Patient location during evaluation: Mother Baby Anesthesia Type: Epidural Level of consciousness: awake, awake and alert and oriented Pain management: pain level controlled Vital Signs Assessment: post-procedure vital signs reviewed and stable Respiratory status: spontaneous breathing and respiratory function stable Cardiovascular status: blood pressure returned to baseline Postop Assessment: no headache, epidural receding, patient able to bend at knees, adequate PO intake, no backache, no apparent nausea or vomiting and able to ambulate Anesthetic complications: no    Last Vitals:  Vitals:   04/19/18 0615 04/19/18 0730  BP: 139/80 124/70  Pulse: 81 79  Resp: 18 17  Temp: 37.2 C 37.4 C  SpO2: 100%     Last Pain:  Vitals:   04/19/18 0905  TempSrc:   PainSc: 0-No pain   Pain Goal:                 Cleda ClarksBrowder, Derya Dettmann R

## 2018-04-19 NOTE — Op Note (Signed)
Lucrezia StarchKimberly Sarvis 04/19/2018  PREOPERATIVE DIAGNOSIS:  Undesired fertility  POSTOPERATIVE DIAGNOSIS:  Undesired fertility  PROCEDURE:  Postpartum Bilateral Tubal Sterilization using Filshie Clips   SURGEON: Surgeon(s) and Role:    Federico Flake* Newton, Antoria Niles, MD - Primary    * Tamera StandsWallace, Zayn Selley S, DO - Fellow - Attending  ANESTHESIA:  Epidural  COMPLICATIONS:  None immediate.  ESTIMATED BLOOD LOSS:  10 mL   INDICATIONS: 32 y.o. yo [id number]  with undesired fertility,status post vaginal delivery, desires permanent sterilization. Risks and benefits of procedure discussed with patient including permanence of method, bleeding, infection, injury to surrounding organs and need for additional procedures. Risk failure of 0.5-1% with increased risk of ectopic gestation if pregnancy occurs was also discussed with patient.   FINDINGS:  Normal uterus, tubes, and ovaries.  TECHNIQUE:  The patient was taken to the operating room where her epidural anesthesia was dosed up to surgical level and found to be adequate.  She was then placed in the dorsal supine position and prepped and draped in sterile fashion.  After an adequate timeout was performed, attention was turned to the patient's abdomen where a small transverse skin incision was made under the umbilical fold. The incision was taken down to the layer of fascia using the scalpel, and fascia was incised, and extended bilaterally. The peritoneum was entered in a sharp fashion. Attention was then turned to the patient's uterus, and right fallopian tube was identified and followed out to the fimbriated end.  A Filshie clip was placed on the left fallopian tube about 3 cm from the cornual attachment, with care given to incorporate the underlying mesosalpinx.  A similar process was carried out on the left side allowing for bilateral tubal sterilization.  Good hemostasis was noted overall.  The instruments were then removed from the patient's abdomen and the fascial  incision was repaired with 0 Vicryl, and the skin was closed with a 3-0 Monocryl subcuticular stitch. The patient tolerated the procedure well.  Sponge, lap, and needle counts were correct times two.  The patient was then taken to the recovery room awake, extubated and in stable condition.  Cristal DeerLaurel S. Earlene PlaterWallace, DO OB/GYN Fellow

## 2018-04-19 NOTE — Anesthesia Preprocedure Evaluation (Signed)
Anesthesia Evaluation  Patient identified by MRN, date of birth, ID band Patient awake    Reviewed: Allergy & Precautions, NPO status   Airway Mallampati: II  TM Distance: >3 FB Neck ROM: Full    Dental no notable dental hx. (+) Teeth Intact   Pulmonary neg pulmonary ROS, former smoker,    Pulmonary exam normal breath sounds clear to auscultation       Cardiovascular Exercise Tolerance: Good negative cardio ROS Normal cardiovascular exam Rhythm:Regular Rate:Normal     Neuro/Psych negative neurological ROS  negative psych ROS   GI/Hepatic   Endo/Other    Renal/GU      Musculoskeletal   Abdominal   Peds  Hematology  (+) anemia ,   Anesthesia Other Findings   Reproductive/Obstetrics                             Lab Results  Component Value Date   WBC 12.7 (H) 04/18/2018   HGB 10.2 (L) 04/18/2018   HCT 30.4 (L) 04/18/2018   MCV 88.6 04/18/2018   PLT 254 04/18/2018    Anesthesia Physical  Anesthesia Plan  ASA: III  Anesthesia Plan: Epidural   Post-op Pain Management:    Induction:   PONV Risk Score and Plan: 2 and Ondansetron, Midazolam and Treatment may vary due to age or medical condition  Airway Management Planned: Nasal Cannula  Additional Equipment:   Intra-op Plan:   Post-operative Plan:   Informed Consent: I have reviewed the patients History and Physical, chart, labs and discussed the procedure including the risks, benefits and alternatives for the proposed anesthesia with the patient or authorized representative who has indicated his/her understanding and acceptance.   Dental advisory given  Plan Discussed with: CRNA  Anesthesia Plan Comments:         Anesthesia Quick Evaluation

## 2018-04-19 NOTE — Anesthesia Postprocedure Evaluation (Signed)
Anesthesia Post Note  Patient: Robin Cochran  Procedure(s) Performed: POST PARTUM TUBAL LIGATION (Bilateral Abdomen)     Patient location during evaluation: Mother Baby Anesthesia Type: Epidural Level of consciousness: awake and alert Pain management: pain level controlled Vital Signs Assessment: post-procedure vital signs reviewed and stable Respiratory status: spontaneous breathing, nonlabored ventilation and respiratory function stable Cardiovascular status: stable Postop Assessment: no headache, no backache and epidural receding Anesthetic complications: no    Last Vitals:  Vitals:   04/19/18 1307 04/19/18 1325  BP: 118/89 128/79  Pulse: 72 69  Resp: 16 18  Temp:  36.8 C  SpO2: 97% 96%    Last Pain:  Vitals:   04/19/18 1326  TempSrc:   PainSc: 0-No pain   Pain Goal:                 Lowella CurbWarren Ray Ame Heagle

## 2018-04-19 NOTE — Discharge Summary (Addendum)
Postpartum Discharge Summary     Patient Name: Robin Cochran DOB: 04-20-1986 MRN: [id number]  Date of admission: 04/18/2018 Delivering Provider: Gwenevere Abbot   Date of discharge: 04/21/2018  Admitting diagnosis: 40wks induction Intrauterine pregnancy: [redacted]w[redacted]d     Secondary diagnosis:  Active Problems:   Previous cesarean section   GBS carrier   Postpartum care following vaginal delivery  Additional problems:  Elevated Blood Pressure      Discharge diagnosis: Term Pregnancy Delivered and VBAC                                                                                                Post partum procedures:None  Augmentation: Pitocin and Foley Balloon  Complications: None  Hospital course:  Induction of Labor With Vaginal Delivery   32 y.o. yo G3P3003 at [redacted]w[redacted]d was admitted to the hospital 04/18/2018 for induction of labor.  Indication for induction: post-dates with BPP 6/10 (NRNST).  Patient had an uncomplicated labor course as follows: Membrane Rupture Time/Date: 12:07 AM ,04/19/2018   Intrapartum Procedures: Episiotomy: None [1]                                         Lacerations:  Periurethral [8]  Patient had delivery of a Viable infant.  Information for the patient's newborn:  Cochran, Robin [[id number]]      04/19/2018  Details of delivery can be found in separate delivery note.  Patient had a routine postpartum course. Patient is discharged home 04/21/18.  Magnesium Sulfate recieved: No BMZ received: No  Physical exam  Vitals:   04/20/18 1441 04/20/18 2238 04/21/18 0526 04/21/18 0650  BP: 138/88 (!) 136/101 (!) 155/103 (!) 141/82  Pulse: 70 72 69 65  Resp: 16 18 18    Temp: 98.3 F (36.8 C)  98.6 F (37 C)   TempSrc: Oral  Oral   SpO2: 98% 100% 100%    General: alert, cooperative and no distress Lochia: appropriate Uterine Fundus: firm Incision: N/A DVT Evaluation: No evidence of DVT seen on physical exam. Negative Homan's sign. No  cords or calf tenderness. No significant calf/ankle edema. Labs: Lab Results  Component Value Date   WBC 12.7 (H) 04/18/2018   HGB 10.2 (L) 04/18/2018   HCT 30.4 (L) 04/18/2018   MCV 88.6 04/18/2018   PLT 254 04/18/2018   CMP Latest Ref Rng & Units 04/18/2018  Glucose 70 - 99 mg/dL 71  BUN 6 - 20 mg/dL <7(W)  Creatinine 2.95 - 1.00 mg/dL 6.21  Sodium 308 - 657 mmol/L 135  Potassium 3.5 - 5.1 mmol/L 3.7  Chloride 98 - 111 mmol/L 105  CO2 22 - 32 mmol/L 22  Calcium 8.9 - 10.3 mg/dL 8.4(O)  Total Protein 6.5 - 8.1 g/dL 6.0(L)  Total Bilirubin 0.3 - 1.2 mg/dL 0.3  Alkaline Phos 38 - 126 U/L 160(H)  AST 15 - 41 U/L 20  ALT 0 - 44 U/L 12    Discharge instruction: per After Visit Summary and "Baby and Me Booklet".  After visit meds:  Allergies as of 04/21/2018   No Known Allergies     Medication List    STOP taking these medications   COMFORT FIT MATERNITY SUPP SM Misc   promethazine 12.5 MG tablet Commonly known as:  PHENERGAN     TAKE these medications   amLODipine 5 MG tablet Commonly known as:  NORVASC Take 1 tablet (5 mg total) by mouth daily.   ferrous sulfate 325 (65 FE) MG tablet Take 1 tablet (325 mg total) by mouth 2 (two) times daily with a meal.   ibuprofen 600 MG tablet Commonly known as:  ADVIL,MOTRIN Take 1 tablet (600 mg total) by mouth every 6 (six) hours.   omeprazole 20 MG capsule Commonly known as:  PRILOSEC Take 1 capsule (20 mg total) by mouth 2 (two) times daily before a meal.   oxyCODONE-acetaminophen 5-325 MG tablet Commonly known as:  PERCOCET Take 1 tablet by mouth every 4 (four) hours as needed for up to 5 days for severe pain (take one tablet by mouth daily for severe pain).   prenatal multivitamin Tabs tablet Take 1 tablet by mouth daily at 12 noon.   senna-docusate 8.6-50 MG tablet Commonly known as:  Senokot-S Take 2 tablets by mouth daily for 7 days. Start taking on:  April 22, 2018       Diet: routine  diet  Activity: Advance as tolerated. Pelvic rest for 6 weeks.   Outpatient follow up:4 weeks Follow up Appt: Future Appointments  Date Time Provider Department Center  05/17/2018  1:30 PM Sharyon Cableogers, Veronica C, CNM [facility] None   Follow up Visit:   Please schedule this patient for Postpartum visit in: 4 weeks with the following provider: Any provider For C/S patients schedule nurse incision check in weeks 2 weeks: no Low risk pregnancy complicated by: None Delivery mode:  SVD Anticipated Birth Control:  BTL done PP PP Procedures needed: BP check  Schedule Integrated BH visit: no  Newborn Data: Live born female  Birth Weight: 6 lb 15.1 oz (3150 g) APGAR: 5, 9  Newborn Delivery   Time head delivered:  04/19/2018 03:59:00 Birth date/time:  04/19/2018 04:00:00 Delivery type:  VBAC, Spontaneous     Baby Feeding: Bottle Disposition:home with mother   04/21/2018 Awilda MetroSarah A Peiffer, MD   OB FELLOW DISCHARGE ATTESTATION  I have seen and examined this patient and agree with above documentation in the resident's note.   Marcy Sirenatherine Herbie Lehrmann, D.O. OB Fellow  04/21/2018, 8:09 AM

## 2018-04-19 NOTE — Lactation Note (Signed)
This note was copied from a baby's chart. Lactation Consultation Note  Patient Name: Robin Cochran [id number]'s Date: 04/19/2018 Reason for consult: Initial assessment;1st time breastfeeding;Term  4916 hours old FT female who is being mostly formula fed by his mother at this point, she's a P3 and not experienced at all with BF, she didn't BF any of her other kids. [facility], but plans on signing up now that baby is here, [place] She doesn't have a pump at home, [facility], pump instructions, cleaning and storage were reviewed, as well as milk storage guidelines.  When reviewing hand expression with mom, she was able to obtain droplets of colostrum out of her left breast, but none out of the right one. LC rubbed it in baby's mouth, and show parents how to do finger feeding, baby started to suck but didn't wake up. Offered assistance with latch, but mom politely declined, baby was asleep and she didn't want to interrupt his sleep, he had also just fed some formula. Asked mom to call for assistance when needed.  Reviewed volumes for formula supplementation according to baby's age, and also formula storage, baby is currently on [employer] Gentle. Discussed cluster feeding and the benefits of STS; baby was bundled in think blankets.  Feeding plan:  1. Encouraged mom to feed baby at the breast STS 8-12 times/24 hours or sooner if feeding cues are present 2. Hand expression/pumping and finger feeding was also encouraged 3. Mother will follow supplementation recommendations according to baby's age in hours  Reviewed BF brochure, BF resources and feeding diary. Parents reported all questions and concerns were answered, they're both aware of LC services and will call PRN.  Maternal Data Formula Feeding for Exclusion: Yes Reason for exclusion: Mother's choice to formula and breast feed on admission Has  patient been taught Hand Expression?: Yes Does the patient have breastfeeding experience prior to this delivery?: No(Mom never BF her other kids)  Feeding   Interventions Interventions: Breast feeding basics reviewed;Breast massage;Hand express;Breast compression;Hand pump  Lactation Tools Discussed/Used Tools: Pump Breast pump type: Manual [place]IC [place] Pump Review: Setup, frequency, and cleaning;Milk Storage Initiated by:: MPeck Date initiated:: 04/19/18   Consult Status Consult Status: Follow-up Date: 04/20/18 Follow-up type: In-patient    Sahar Ryback Venetia ConstableS Kazi Montoro 04/19/2018, 8:37 PM

## 2018-04-19 NOTE — Anesthesia Procedure Notes (Signed)
Epidural Patient location during procedure: OB Start time: 04/19/2018 12:27 AM End time: 04/19/2018 12:45 AM  Staffing Anesthesiologist: Trevor IhaHouser, Mccall Lomax A, MD Performed: anesthesiologist   Preanesthetic Checklist Completed: patient identified, site marked, surgical consent, pre-op evaluation, timeout performed, IV checked, risks and benefits discussed and monitors and equipment checked  Epidural Patient position: sitting Prep: site prepped and draped and DuraPrep Patient monitoring: continuous pulse ox and blood pressure Approach: midline Location: L3-L4 Injection technique: LOR air  Needle:  Needle type: Tuohy  Needle gauge: 17 G Needle length: 9 cm and 9 Needle insertion depth: 9 cm Catheter type: closed end flexible Catheter size: 19 Gauge Catheter at skin depth: 15 cm Test dose: negative  Assessment Events: blood not aspirated, injection not painful, no injection resistance, negative IV test and no paresthesia  Additional Notes Patient identified. Risks/Benefits/Options discussed with patient including but not limited to bleeding, infection, nerve damage, paralysis, failed block, incomplete pain control, headache, blood pressure changes, nausea, vomiting, reactions to medication both or allergic, itching and postpartum back pain. Confirmed with bedside nurse the patient's most recent platelet count. Confirmed with patient that they are not currently taking any anticoagulation, have any bleeding history or any family history of bleeding disorders. Patient expressed understanding and wished to proceed. All questions were answered. Sterile technique was used throughout the entire procedure. Please see nursing notes for vital signs. Test dose was given through epidural needle and negative prior to continuing to dose epidural or start infusion. Warning signs of high block given to the patient including shortness of breath, tingling/numbness in hands, complete motor block, or any  concerning symptoms with instructions to call for help. Patient was given instructions on fall risk and not to get out of bed. All questions and concerns addressed with instructions to call with any issues. 2 Attempt (S) . Patient tolerated procedure well.

## 2018-04-19 NOTE — Anesthesia Preprocedure Evaluation (Signed)
Anesthesia Evaluation  Patient identified by MRN, date of birth, ID band Patient awake    Reviewed: Allergy & Precautions, NPO status   Airway Mallampati: II  TM Distance: >3 FB Neck ROM: Full    Dental no notable dental hx. (+) Teeth Intact   Pulmonary neg pulmonary ROS, former smoker,    Pulmonary exam normal breath sounds clear to auscultation       Cardiovascular Exercise Tolerance: Good negative cardio ROS Normal cardiovascular exam Rhythm:Regular Rate:Normal     Neuro/Psych negative neurological ROS  negative psych ROS   GI/Hepatic   Endo/Other    Renal/GU      Musculoskeletal   Abdominal   Peds  Hematology  (+) anemia ,   Anesthesia Other Findings   Reproductive/Obstetrics (+) Pregnancy                             Lab Results  Component Value Date   WBC 12.7 (H) 04/18/2018   HGB 10.2 (L) 04/18/2018   HCT 30.4 (L) 04/18/2018   MCV 88.6 04/18/2018   PLT 254 04/18/2018    Anesthesia Physical Anesthesia Plan  ASA: III  Anesthesia Plan: Epidural   Post-op Pain Management:    Induction:   PONV Risk Score and Plan:   Airway Management Planned:   Additional Equipment:   Intra-op Plan:   Post-operative Plan:   Informed Consent: I have reviewed the patients History and Physical, chart, labs and discussed the procedure including the risks, benefits and alternatives for the proposed anesthesia with the patient or authorized representative who has indicated his/her understanding and acceptance.     Plan Discussed with: CRNA  Anesthesia Plan Comments:         Anesthesia Quick Evaluation

## 2018-04-20 ENCOUNTER — Encounter: Payer: Medicaid Other | Admitting: Certified Nurse Midwife

## 2018-04-20 ENCOUNTER — Other Ambulatory Visit: Payer: Self-pay

## 2018-04-20 MED ORDER — ZOLPIDEM TARTRATE 5 MG PO TABS
5.0000 mg | ORAL_TABLET | Freq: Once | ORAL | Status: AC | PRN
  Administered 2018-04-20: 5 mg via ORAL
  Filled 2018-04-20: qty 1

## 2018-04-20 NOTE — Progress Notes (Signed)
Post Partum Day 1 Subjective: Feels overall well but does have some abdominal swelling and discomfort at the site of her PPBTL and requests an additional day. Has passed gas and no vomiting.   Objective: Blood pressure 128/85, pulse 77, temperature 98.7 F (37.1 C), resp. rate 18, last menstrual period 06/06/2017, SpO2 99 %, unknown if currently breastfeeding.  Physical Exam:  General: alert, cooperative and no distress Lochia: appropriate Uterine Fundus: firm Incision: healing well, no significant drainage, no significant erythema DVT Evaluation: No evidence of DVT seen on physical exam. Negative Homan's sign. No cords or calf tenderness. No significant calf/ankle edema.  Recent Labs    04/18/18 1527  HGB 10.2*  HCT 30.4*    Assessment/Plan: Plan for discharge tomorrow Bottle feeding. S/p BTL for contraception.   LOS: 2 days   Robin MetroSarah A Kemora Cochran 04/20/2018, 6:53 AM

## 2018-04-21 ENCOUNTER — Inpatient Hospital Stay (HOSPITAL_COMMUNITY): Admission: RE | Admit: 2018-04-21 | Payer: Medicaid Other | Source: Ambulatory Visit

## 2018-04-21 MED ORDER — OXYCODONE-ACETAMINOPHEN 5-325 MG PO TABS: 1.0000 | ORAL_TABLET | ORAL | 0 refills | Status: DC | PRN

## 2018-04-21 MED ORDER — IBUPROFEN 600 MG PO TABS: 600.0000 mg | ORAL_TABLET | Freq: Four times a day (QID) | ORAL | 0 refills | Status: DC

## 2018-04-21 MED ORDER — SENNOSIDES-DOCUSATE SODIUM 8.6-50 MG PO TABS: 2.0000 | ORAL_TABLET | ORAL | 0 refills | Status: AC

## 2018-04-21 MED ORDER — AMLODIPINE BESYLATE 5 MG PO TABS: 5.0000 mg | ORAL_TABLET | Freq: Every day | ORAL | 2 refills | Status: DC

## 2018-04-21 NOTE — Lactation Note (Signed)
This note was copied from a baby's chart. Lactation Consultation Note  Patient Name: Robin Cochran [id number]'WToday's Date: 04/21/2018 Reason for consult: Follow-up assessment;Term  P3 mother whose infant is now 2253 hours old  Mother's feeding choice on admission was breast/bottle.  She has given quite a bit of formula.  She feels like her breasts are getting fuller today.  Encouraged always feeding at the breast first prior to supplementing with formula.    Engorgement prevention/treatment discussed.  Mother has a manual pump for home use.  [facility] but [facility] today.  She will be obtaining a DEBP for home use.  Mother plans on returning to work in 6 weeks.  Discussed preparing for transitioning from home to work and how to pump and freeze milk.  Mother had no further questions/concerns.  Father present.   Maternal Data Formula Feeding for Exclusion: No Has patient been taught Hand Expression?: Yes  Feeding Feeding Type: Bottle Fed - Formula Nipple Type: Slow - flow  LATCH Score                   Interventions    Lactation Tools Discussed/Used WIC Program: No(Will be calling WIC today)   Consult Status Consult Status: Complete Date: 04/21/18 Follow-up type: Call as needed    Japji Kok R Kastin Cerda 04/21/2018, 9:10 AM

## 2018-04-24 ENCOUNTER — Telehealth: Payer: Self-pay | Admitting: General Practice

## 2018-04-24 NOTE — Telephone Encounter (Signed)
Please review for refill for pain meds

## 2018-04-24 NOTE — Telephone Encounter (Signed)
Patient called and left message on nurse voicemail line stating she needs a refill on her pain medication. Patient states she was only given 5 pills and is having complications in her tubal area. Patient states she is still having pain.

## 2018-04-25 ENCOUNTER — Inpatient Hospital Stay (HOSPITAL_COMMUNITY)
Admission: AD | Admit: 2018-04-25 | Discharge: 2018-04-25 | Disposition: A | Discharge status: Home / Self Care | Payer: Medicaid Other | Attending: Obstetrics and Gynecology | Admitting: Obstetrics and Gynecology

## 2018-04-25 DIAGNOSIS — O165 Unspecified maternal hypertension, complicating the puerperium: Secondary | ICD-10-CM | POA: Diagnosis not present

## 2018-04-25 DIAGNOSIS — Z87891 Personal history of nicotine dependence: Secondary | ICD-10-CM | POA: Diagnosis not present

## 2018-04-25 DIAGNOSIS — O9089 Other complications of the puerperium, not elsewhere classified: Secondary | ICD-10-CM | POA: Insufficient documentation

## 2018-04-25 DIAGNOSIS — L7682 Other postprocedural complications of skin and subcutaneous tissue: Secondary | ICD-10-CM

## 2018-04-25 MED ORDER — OXYCODONE-ACETAMINOPHEN 5-325 MG PO TABS: 1.0000 | ORAL_TABLET | Freq: Four times a day (QID) | ORAL | 0 refills | Status: AC | PRN

## 2018-04-25 MED ORDER — AMLODIPINE BESYLATE 5 MG PO TABS: 5.0000 mg | ORAL_TABLET | Freq: Every day | ORAL | 5 refills | Status: DC

## 2018-04-25 NOTE — MAU Note (Signed)
Pt s/p tubal ligation on 12/14, wants someone to look at her dressing because there is a lot of blood under the dressing.

## 2018-04-25 NOTE — MAU Provider Note (Signed)
Chief Complaint: Wound Check   None     SUBJECTIVE HPI: Robin Cochran is a 32 y.o. G3P3003 pt of [facility] who is PPD # 7 following VBAC and POD # 7 following BTL who presents to maternity admissions reporting pain at umbilical incision and blood noted on honeycomb dressing.  She reports she was discharged with pain medication but only 4 tablets of Percocet so she has only taken ibuprofen today. Ibuprofen helps but the pain is still there.  She was worried that the dressing looked completely saturated with bleeding so she came in. Her scheduled incision check is tomorrow in the office. She was given Norvasc in the hospital but was not discharged with a prescription so she has not taken it and requests this prescription.  She denies h/a, epigastric pain, or visual disturbances.  There are no other symptoms. She has not tried other treatments.    HPI  Past Medical History:  Diagnosis Date  . Anemia    Past Surgical History:  Procedure Laterality Date  . CESAREAN SECTION    . TONSILLECTOMY    . TUBAL LIGATION Bilateral 04/19/2018   Procedure: POST PARTUM TUBAL LIGATION;  Surgeon: Federico Flake, MD;  Location: [facility] BIRTHING SUITES;  Service: Gynecology;  Laterality: Bilateral;   Social History   Socioeconomic History  . Marital status: Single    Spouse name: Not on file  . Number of children: 2  . Years of education: Not on file  . Highest education level: High school graduate  Occupational History  . Occupation: unemployed  Social Needs  . Financial resource strain: Not hard at all  . Food insecurity:    Worry: Never true    Inability: Never true  . Transportation needs:    Medical: No    Non-medical: No  Tobacco Use  . Smoking status: Former Smoker    Types: E-cigarettes    Last attempt to quit: 05/11/2011    Years since quitting: 6.9  . Smokeless tobacco: Never Used  . Tobacco comment: pt states, "I vape sometimes."  Substance and Sexual Activity  . Alcohol use:  Yes  . Drug use: No  . Sexual activity: Yes    Partners: Male    Birth control/protection: None  Lifestyle  . Physical activity:    Days per week: 0 days    Minutes per session: 0 min  . Stress: Only a little  Relationships  . Social connections:    Talks on phone: Three times a week    Gets together: More than three times a week    Attends religious service: Not on file    Active member of club or organization: Yes    Attends meetings of clubs or organizations: 1 to 4 times per year    Relationship status: Never married  . Intimate partner violence:    Fear of current or ex partner: Not on file    Emotionally abused: Not on file    Physically abused: Not on file    Forced sexual activity: Not on file  Other Topics Concern  . Not on file  Social History Narrative  . Not on file   No current facility-administered medications on file prior to encounter.    Current Outpatient Medications on File Prior to Encounter  Medication Sig Dispense Refill  . ferrous sulfate 325 (65 FE) MG tablet Take 1 tablet (325 mg total) by mouth 2 (two) times daily with a meal. 60 tablet 5  . omeprazole (PRILOSEC) 20  MG capsule Take 1 capsule (20 mg total) by mouth 2 (two) times daily before a meal. 60 capsule 5  . Prenatal Vit-Fe Fumarate-FA (PRENATAL MULTIVITAMIN) TABS tablet Take 1 tablet by mouth daily at 12 noon.    . senna-docusate (SENOKOT-S) 8.6-50 MG tablet Take 2 tablets by mouth daily for 7 days. 14 tablet 0   No Known Allergies  ROS:  Review of Systems  Constitutional: Negative for chills, fatigue and fever.  Respiratory: Negative for shortness of breath.   Cardiovascular: Negative for chest pain.  Gastrointestinal: Positive for abdominal pain.  Genitourinary: Negative for difficulty urinating, dysuria, flank pain, pelvic pain, vaginal bleeding, vaginal discharge and vaginal pain.  Skin: Positive for wound.  Neurological: Negative for dizziness and headaches.   Psychiatric/Behavioral: Negative.      I have reviewed patient's Past Medical Hx, Surgical Hx, Family Hx, Social Hx, medications and allergies.   Physical Exam   Patient Vitals for the past 24 hrs:  BP Temp Temp src Pulse Resp SpO2 Height Weight  04/25/18 1000 (!) 146/88 98.6 F (37 C) Oral 61 16 98 % 5\' 6"  (1.676 m) 102.1 kg   Constitutional: Well-developed, well-nourished female in no acute distress.  HEART: normal rate, heart sounds, regular rhythm RESP: normal effort, lung sounds clear and equal bilaterally GI: Abd soft, non-tender. Pos BS x 4.  Honeycomb dressing removed. Dressing saturate with serosanguinous nonmalodorous drainage.   Incision well approximated, no active bleeding or drainage. No edema or erythema.   MS: Extremities nontender, no edema, normal ROM Neurologic: Alert and oriented x 4.  GU: Neg CVAT.     LAB RESULTS No results found for this or any previous visit (from the past 24 hour(s)).  --/--/O POS, O POS Performed at [facility], [address] [place], [place] [address]  (12/10 1527)  IMAGING   MAU Management/MDM: Incision healing well, no evidence of infection. Pain is mild and pt is out of narcotic pain medication, as she was prescribed 5 tablets at discharge 1 week ago.  No acute abdomen on exam.  Dressing saturated with mostly serous fluid so removed and no drainage or bleeding noted at incision.  No evidence of infection or dehiscence of wound.  Sterile gauze/tape used to lightly redress incision.  [facility] and BP [facility]. Rx for Percocet 5/325, take 1-2 Q 6 hours PRN x 20 tabs.  Rx for Norvasc 5 mg daily with 5 refills.  Preeclampsia and infection precautions reviewed.  Pt discharged with strict return precautions.  ASSESSMENT 1. Pain at surgical incision   2. Postpartum hypertension     PLAN Discharge home Allergies as of 04/25/2018   No Known Allergies     Medication List     STOP taking these medications   ibuprofen 600 MG tablet Commonly known as:  ADVIL,MOTRIN     TAKE these medications   amLODipine 5 MG tablet Commonly known as:  NORVASC Take 1 tablet (5 mg total) by mouth daily.   ferrous sulfate 325 (65 FE) MG tablet Take 1 tablet (325 mg total) by mouth 2 (two) times daily with a meal.   omeprazole 20 MG capsule Commonly known as:  PRILOSEC Take 1 capsule (20 mg total) by mouth 2 (two) times daily before a meal.   oxyCODONE-acetaminophen 5-325 MG tablet Commonly known as:  PERCOCET Take 1 tablet by mouth every 6 (six) hours as needed for up to 5 days for severe pain (take one  tablet by mouth daily for severe pain). What changed:  when to take this   prenatal multivitamin Tabs tablet Take 1 tablet by mouth daily at 12 noon.   senna-docusate 8.6-50 MG tablet Commonly known as:  Senokot-S Take 2 tablets by mouth daily for 7 days.      Follow-up Information    [facility] Follow up.   Why:  Tomorrow as scheduled. Return to MAU as needed for emergencies. Contact information: [address] [place] [address] [phone]          Sharen Counter Certified Nurse-Midwife 04/25/2018  5:03 PM

## 2018-04-28 ENCOUNTER — Encounter: Payer: Self-pay | Admitting: Obstetrics and Gynecology

## 2018-04-28 DIAGNOSIS — O1400 Mild to moderate pre-eclampsia, unspecified trimester: Secondary | ICD-10-CM | POA: Insufficient documentation

## 2018-05-16 ENCOUNTER — Telehealth: Payer: Self-pay

## 2018-05-16 NOTE — Telephone Encounter (Signed)
Robin Cochran from [employer] called to inform that pt bp was 130/92 today at her home visit. Pt has no other sx. Pt has appt tomorrow, so this will be addressed at her appt.

## 2018-05-17 ENCOUNTER — Ambulatory Visit (INDEPENDENT_AMBULATORY_CARE_PROVIDER_SITE_OTHER): Payer: Medicaid Other | Admitting: Certified Nurse Midwife

## 2018-05-17 ENCOUNTER — Encounter: Payer: Self-pay | Admitting: Certified Nurse Midwife

## 2018-05-17 DIAGNOSIS — Z1389 Encounter for screening for other disorder: Secondary | ICD-10-CM | POA: Diagnosis not present

## 2018-05-17 DIAGNOSIS — O10919 Unspecified pre-existing hypertension complicating pregnancy, unspecified trimester: Secondary | ICD-10-CM

## 2018-05-17 DIAGNOSIS — O169 Unspecified maternal hypertension, unspecified trimester: Secondary | ICD-10-CM

## 2018-05-17 DIAGNOSIS — F411 Generalized anxiety disorder: Secondary | ICD-10-CM

## 2018-05-17 MED ORDER — SERTRALINE HCL 25 MG PO TABS: 25.0000 mg | ORAL_TABLET | Freq: Every day | ORAL | 1 refills | Status: DC

## 2018-05-17 MED ORDER — AMLODIPINE BESYLATE 10 MG PO TABS: 10.0000 mg | ORAL_TABLET | Freq: Every day | ORAL | 0 refills | Status: DC

## 2018-05-17 NOTE — Progress Notes (Signed)
Post Partum Exam  Robin Cochran is a 33 y.o. G68P3003 female who presents for a postpartum visit. She is 4 weeks postpartum following a spontaneous vaginal delivery. I have fully reviewed the prenatal and intrapartum course. The delivery was at 40 gestational weeks.  Anesthesia: epidural. Postpartum course has been well. Baby's course has been well. Baby is feeding by [employer] Start . Bleeding no bleeding. Bowel function is normal. Bladder function is normal. Patient is not sexually active. Contraception method is tubal ligation. Postpartum depression screening:neg  The following portions of the patient's history were reviewed and updated as appropriate: allergies, current medications, past social history, past surgical history and problem list. Last pap smear done 12/07/2017 and was Normal  Review of Systems Pertinent items noted in HPI and remainder of comprehensive ROS otherwise negative.    Objective:  Blood pressure 137/85, pulse 81, weight 218 lb (98.9 kg), not currently breastfeeding.  General:  alert, cooperative and no distress   Breasts:  inspection negative, no nipple discharge or bleeding, no masses or nodularity palpable  Lungs: clear to auscultation bilaterally  Heart:  regular rate and rhythm, S1, S2 normal, no murmur, click, rub or gallop  Abdomen: soft, non-tender; bowel sounds normal; no masses,  no organomegaly, umbilical incision healed    Vulva:  not evaluated  Vagina: not evaluated  Cervix:  not evaluated  Corpus: not examined  Adnexa:  not evaluated  Rectal Exam: Not performed.        Assessment/Plan:  1. Postpartum care and examination - Normal postpartum examination, BP continues to be slightly elevated. Contraception: tubal ligation  2. Generalized anxiety disorder - Patient reports new onset of anxiety and stressing/anxious about everything being in order in addition to "life situations with father of baby" that are causing more anxiety.  - Denies  depression being associated with anxiety, denies SI or thoughts of harm to others.  - Initial Rx for medication sent, patient plans for anxiety to be managed by PCP - Mood check in 2 weeks with BP check   - sertraline (ZOLOFT) 25 MG tablet; Take 1 tablet (25 mg total) by mouth daily.  Dispense: 30 tablet; Refill: 1  3. Essential hypertension during pregnancy - Patient reports never picking up medication and has not been taking  - Denies HA, vision changes or RUQ pain  - Patient to begin taking medication- Rx sent to pharmacy of choice and return in 2 weeks for BP check, patient verbalizes understanding  - amLODipine (NORVASC) 10 MG tablet; Take 1 tablet (10 mg total) by mouth daily.  Dispense: 30 tablet; Refill: 0  Sharyon Cable, CNM 05/17/18, 2:08 PM

## 2018-05-19 DIAGNOSIS — Z3482 Encounter for supervision of other normal pregnancy, second trimester: Secondary | ICD-10-CM

## 2018-05-31 ENCOUNTER — Encounter (INDEPENDENT_AMBULATORY_CARE_PROVIDER_SITE_OTHER): Payer: Medicaid Other

## 2018-10-28 IMAGING — US US MFM OB FOLLOW-UP
1 series · 14 of 28 positions shown · non-contrast
Comparison: none

[Series 1: us mfm ob follow-up · 41 acquisitions, 14 frames shown]
[im 2/41]
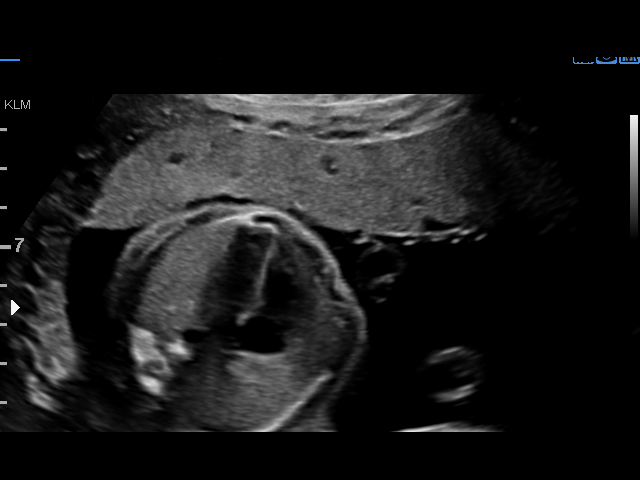
[im 5/41]
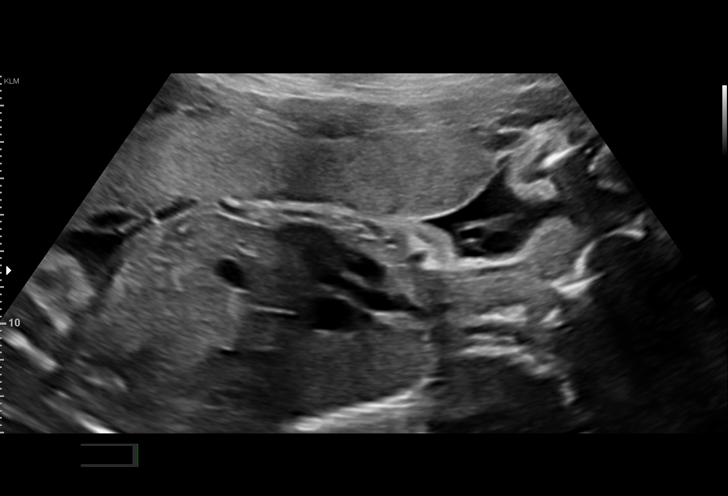
[im 8/41]
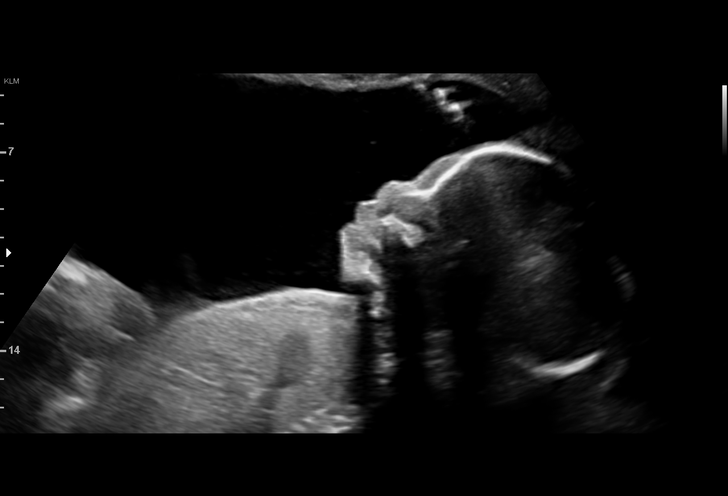
[im 11/41]
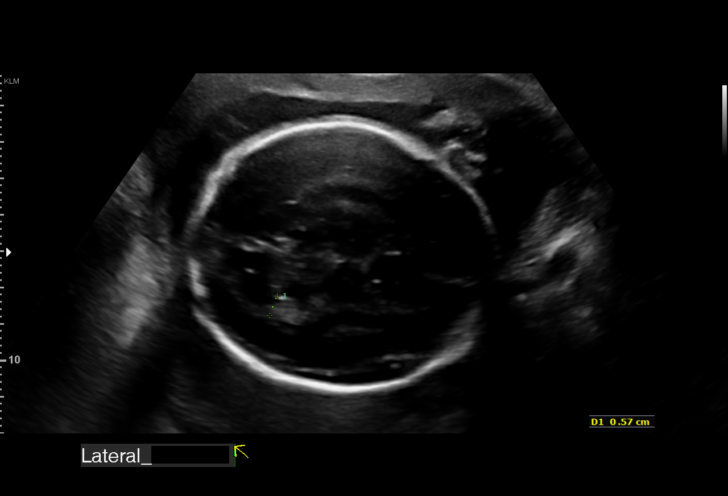
[im 14/41]
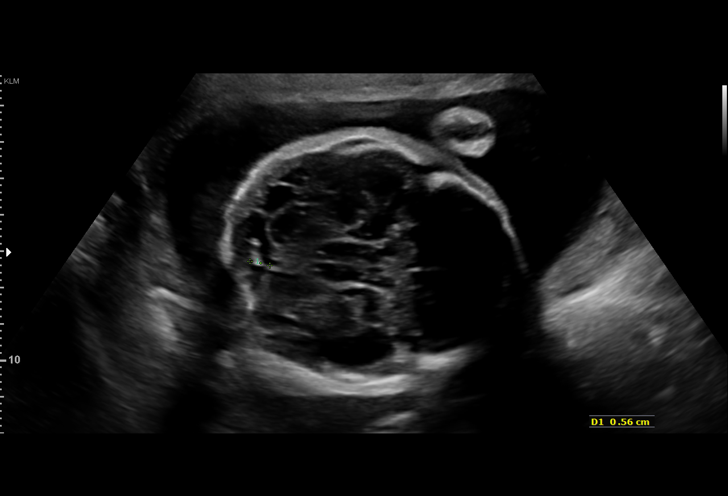
[im 17/41]
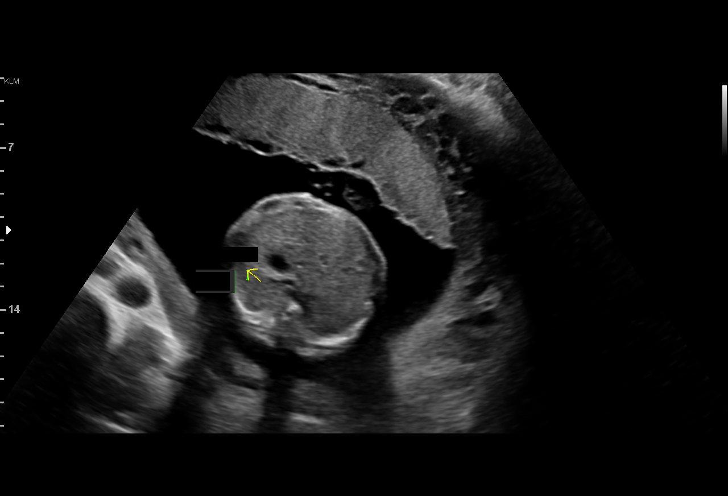
[im 20/41]
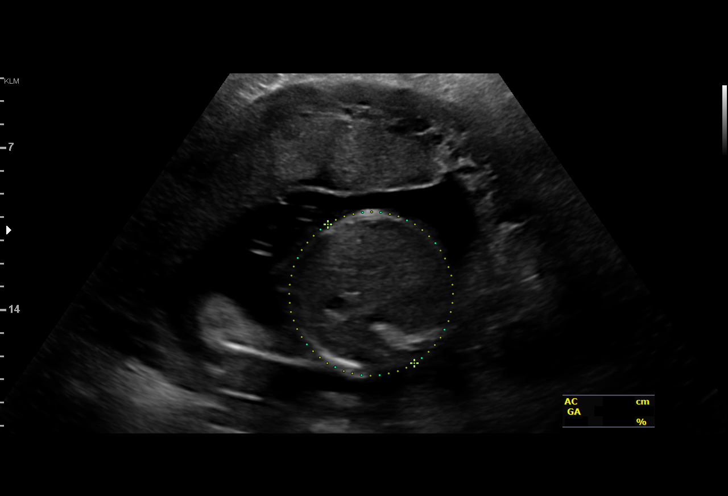
[im 23/41]
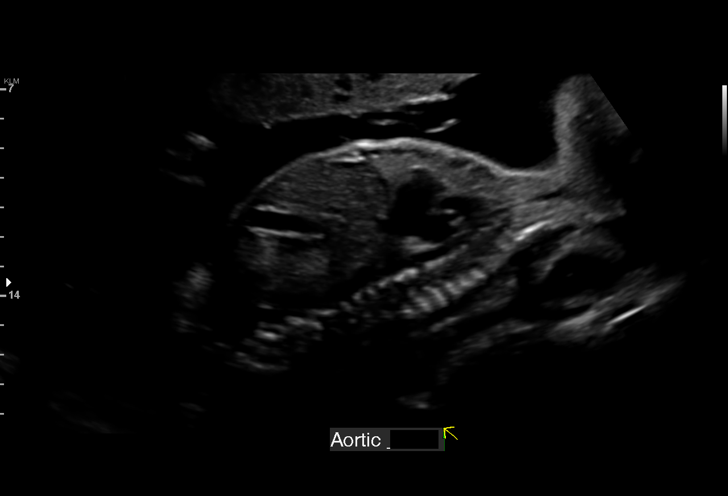
[im 26/41]
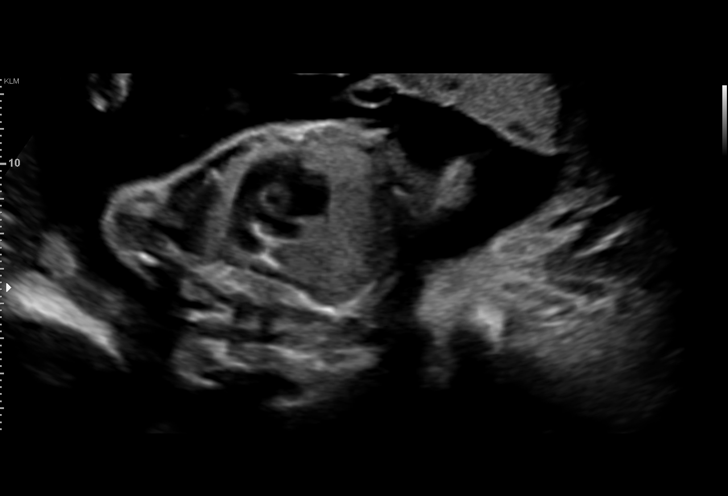
[im 29/41]
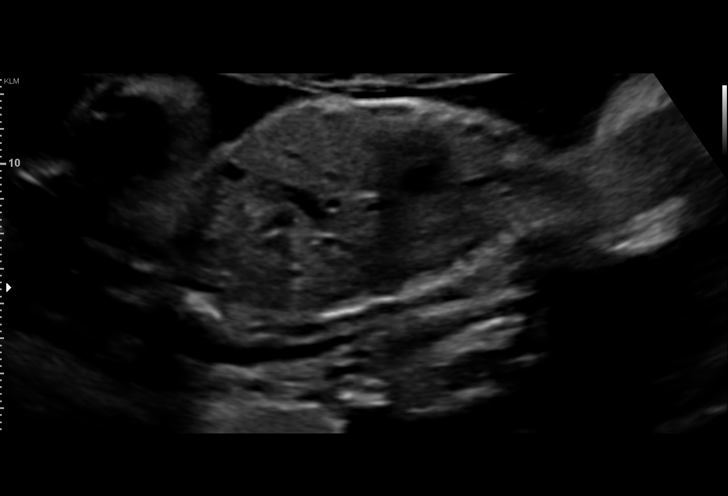
[im 32/41]
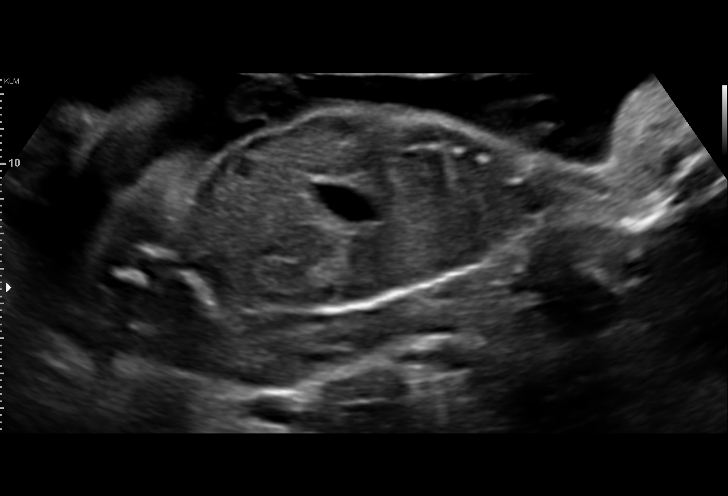
[im 35/41]
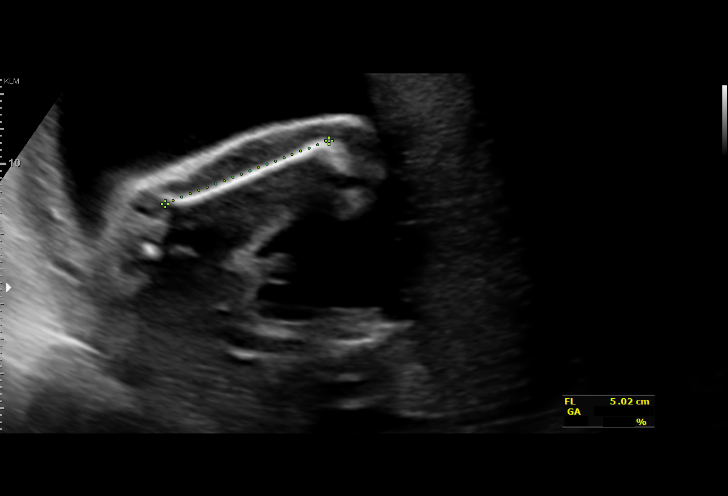
[im 38/41]
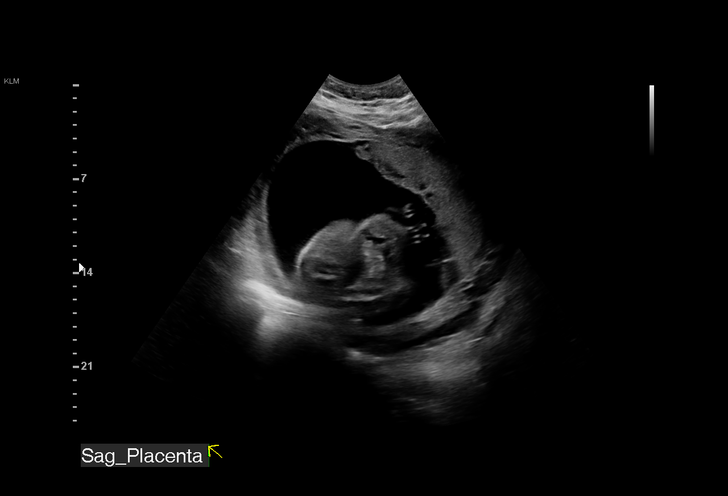
[im 41/41]
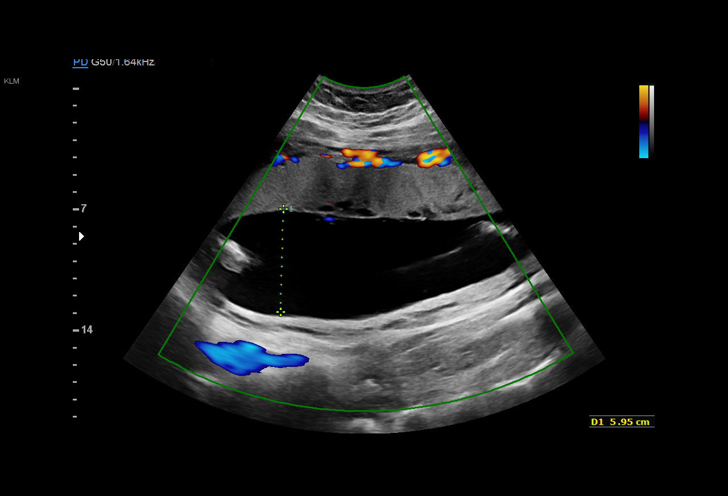

[14 of 28 positions shown; findings below may reference images not displayed]

[REDACTED]care - [HOSPITAL]

Indications

Fetal abnormality - other known or
suspected (enlarged kidneys, face)
27 weeks gestation of pregnancy
Encounter for uncertain dates
Antenatal follow-up for nonvisualized fetal
anatomy
Fetal Evaluation

Num Of Fetuses:         1
Fetal Heart Rate(bpm):  151
Cardiac Activity:       Observed
Presentation:           Cephalic
Placenta:               Anterior
P. Cord Insertion:      Previously Visualized

Amniotic Fluid
AFI FV:      Within normal limits
Biometry

BPD:      71.6  mm     G. Age:  28w 5d         76  %    CI:        77.05   %    70 - 86
FL/HC:      19.1   %    18.8 -
HC:      258.3  mm     G. Age:  28w 0d         37  %    HC/AC:      1.14        1.05 -
AC:      226.7  mm     G. Age:  27w 0d         27  %    FL/BPD:     68.9   %    71 - 87
FL:       49.3  mm     G. Age:  26w 4d         13  %    FL/AC:      21.7   %    20 - 24

Est. FW:    3184  gm      2 lb 4 oz     42  %
OB History
Gravidity:    3         Term:   2        Prem:   0        SAB:   0
TOP:          0       Ectopic:  0        Living: 2
Gestational Age

LMP:           32w 1d        Date:  06/06/17                 EDD:   03/13/18
U/S Today:     27w 4d                                        EDD:   04/14/18
Best:          27w 4d     Det. By:  U/S  (12/20/17)          EDD:   04/14/18
Anatomy

Cranium:               Appears normal         Aortic Arch:            Appears normal
Cavum:                 Appears normal         Ductal Arch:            Appears normal
Ventricles:            Appears normal         Diaphragm:              Previously seen
Choroid Plexus:        Previously seen        Stomach:                Appears normal, left
sided
Cerebellum:            Appears normal         Abdomen:                Appears normal
Posterior Fossa:       Previously seen        Abdominal Wall:         Previously seen
Nuchal Fold:           Not applicable (>20    Cord Vessels:           Previously seen
wks GA)
Face:                  Orbits previously      Kidneys:                Appears enlarged
seen, profile NL                               (not cystic)
Lips:                  Appears normal         Bladder:                Appears normal
Thoracic:              Appears normal         Spine:                  Previously seen
Heart:                 Previously seen        Upper Extremities:      Previously seen
RVOT:                  Appears normal         Lower Extremities:      Previously seen
LVOT:                  Previously seen

Other:  Heels and 5th digit previously visualized. Nasal bone previously
visualized. Technically difficult due to fetal position.
Cervix Uterus Adnexa

Cervix
Length:            3.5  cm.
Normal appearance by transabdominal scan.
Impression

Patient returned for evaluation of kidneys.
Amniotic fluid is normal and good fetal activity is seen. Fetal
growth is appropriate for gestational age. Both kidneys look
normal, but the measurements (lengths) is at greater than the
95th percentiles. No evidence of cysts.
I reassured the patient of the findings (appears normal variant
with no pathology).
Recommendations

An appointment was made for her to return in 4 weeks for
fetal growth and renal assessments.

## 2019-01-27 IMAGING — US US MFM FETAL BPP W/ NON-STRESS
1 series · 15 of 28 positions shown · non-contrast
Comparison: none

[Series 1: us mfm fetal bpp w/ non-stress · 37 acquisitions, 15 frames shown]
[im 1/37]
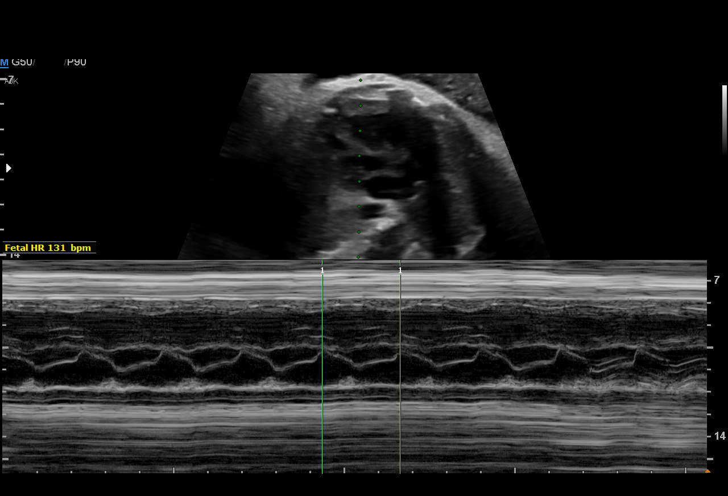
[im 3/37]
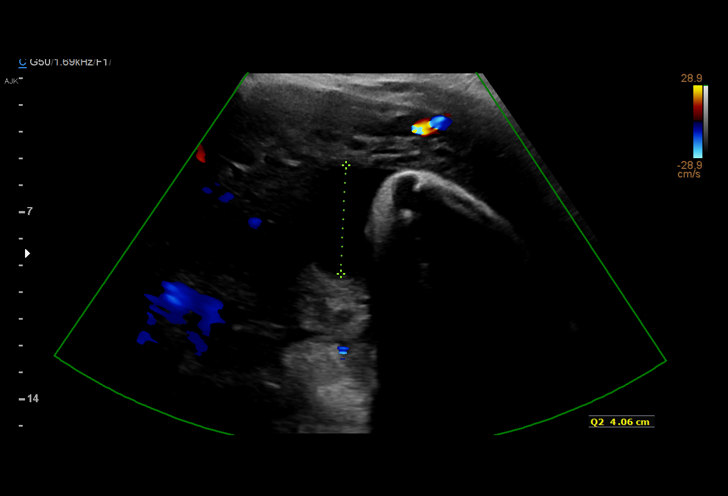
[im 6/37]
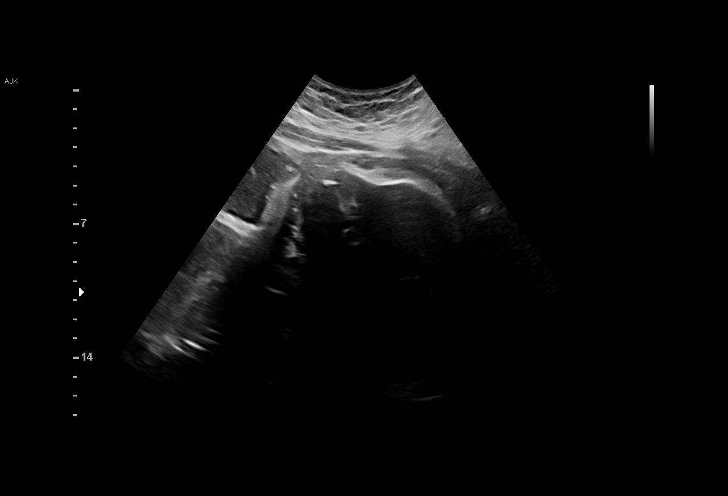
[im 9/37]
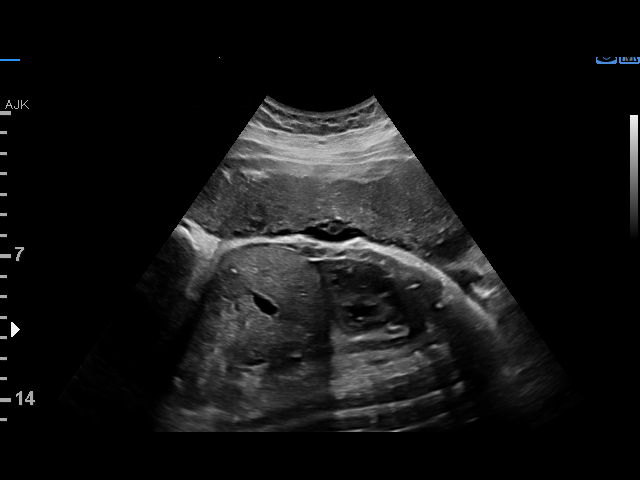
[im 11/37]
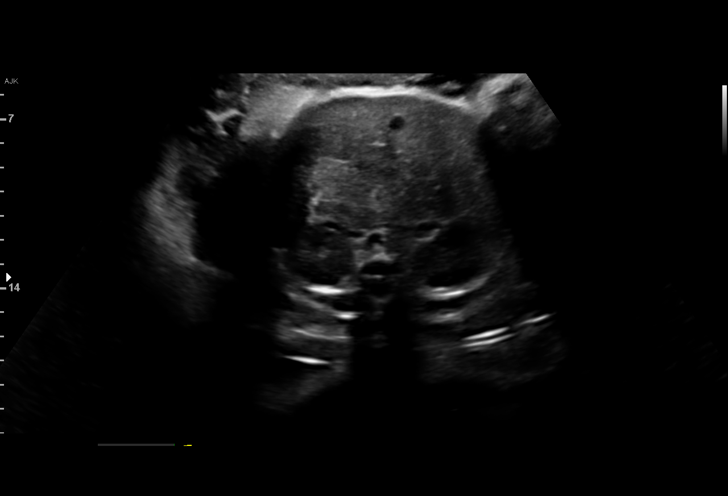
[im 14/37]
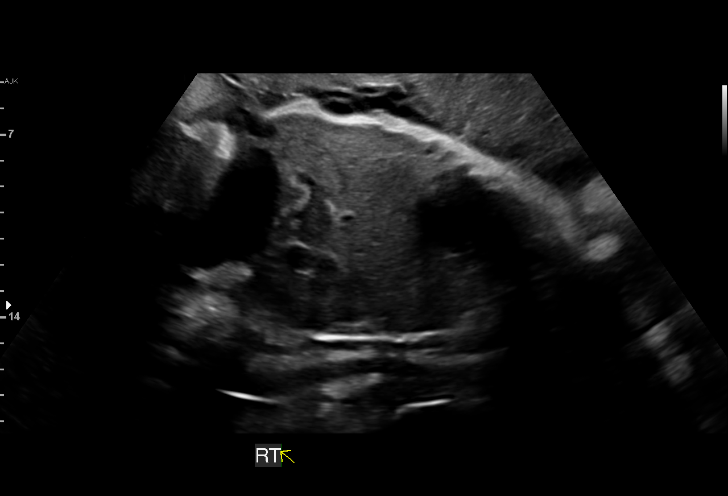
[im 17/37]
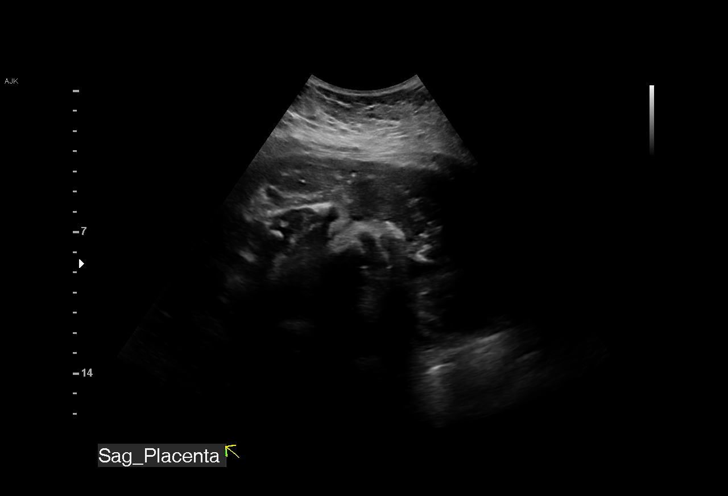
[im 19/37]
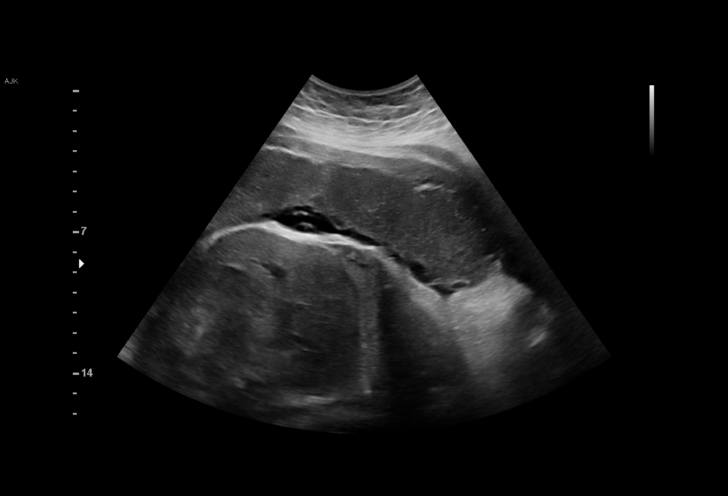
[im 21/37]
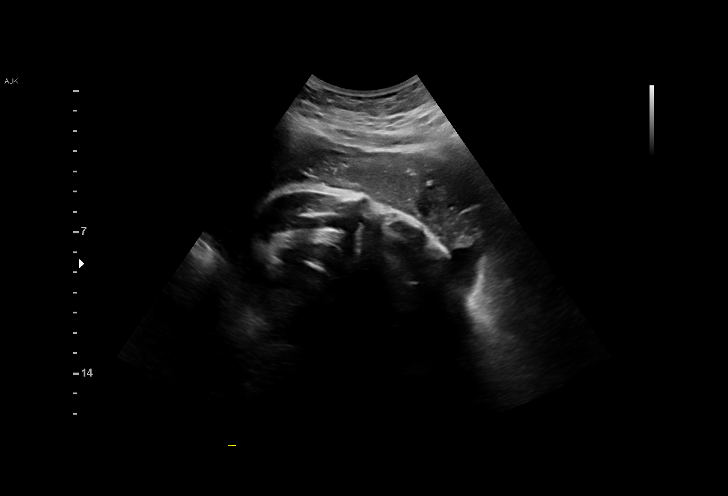
[im 23/37]
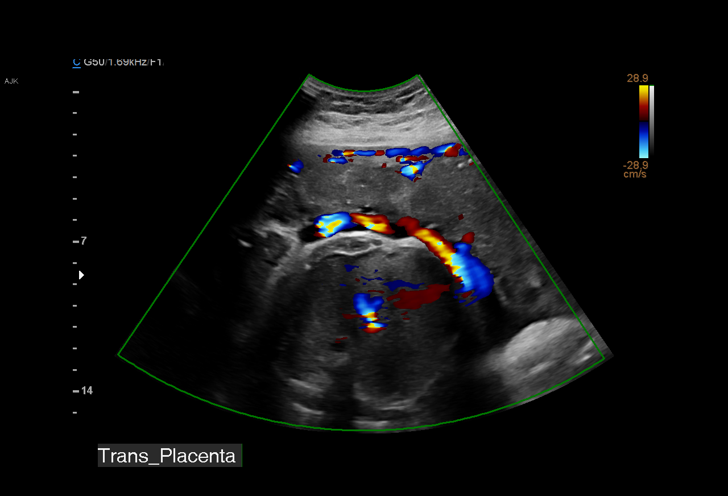
[im 26/37]
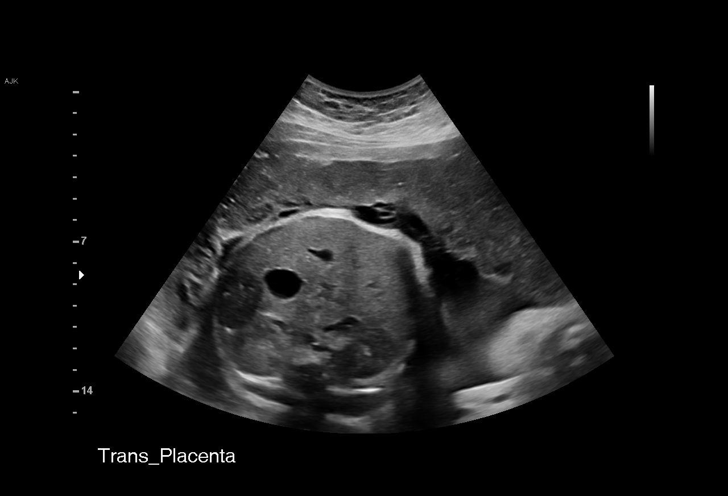
[im 29/37]
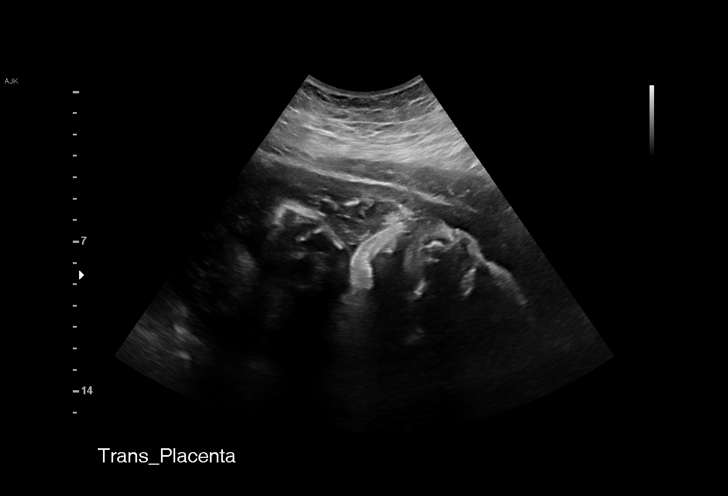
[im 31/37]
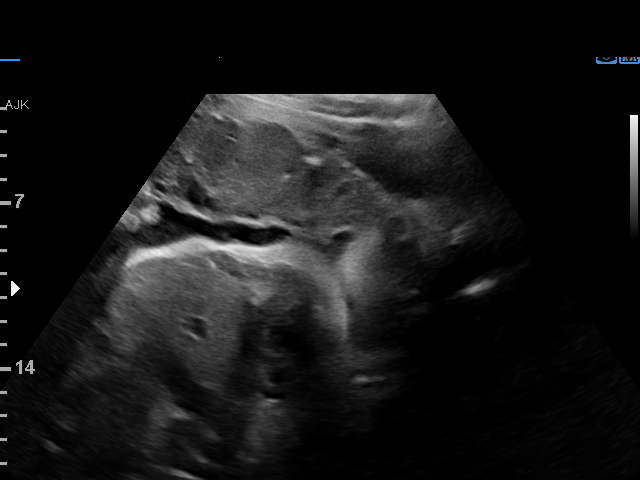
[im 34/37]
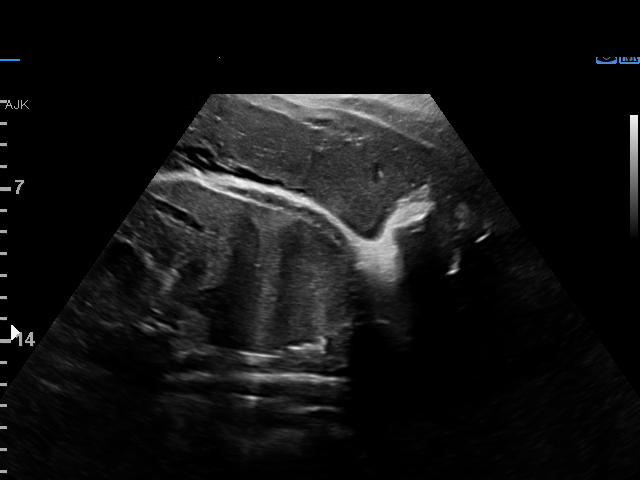
[im 37/37]
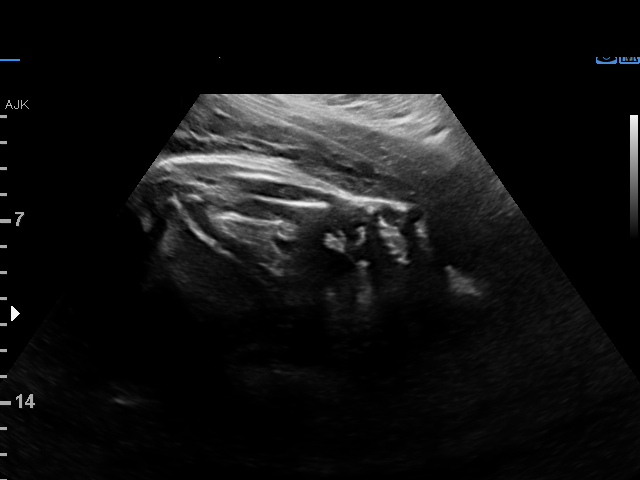

[15 of 28 positions shown; findings below may reference images not displayed]

[REDACTED]care - [HOSPITAL]

     W/NONSTRESS
 ----------------------------------------------------------------------

 ----------------------------------------------------------------------
Indications

  40 weeks gestation of pregnancy
 ----------------------------------------------------------------------
Fetal Evaluation

 Num Of Fetuses:         1
 Fetal Heart Rate(bpm):  131
 Cardiac Activity:       Observed
 Presentation:           Cephalic
 Placenta:               Anterior

 Amniotic Fluid
 AFI FV:      Within normal limits

 AFI Sum(cm)     %Tile       Largest Pocket(cm)
 12.82           55

 RUQ(cm)       RLQ(cm)       LUQ(cm)        LLQ(cm)


 Comment:    Stomach, bladder, and kidneys seen.
Biophysical Evaluation

 Amniotic F.V:   Within normal limits       F. Tone:        Not Observed
 F. Movement:    Observed                   N.S.T:          Nonreactive
 F. Breathing:   Observed                   Score:          [DATE]
OB History
 Gravidity:    3         Term:   2        Prem:   0        SAB:   0
 TOP:          0       Ectopic:  0        Living: 2
Gestational Age

 LMP:           45w 1d        Date:  06/06/17                 EDD:   03/13/18
 Best:          40w 4d     Det. By:  U/S  (12/20/17)          EDD:   04/14/18
Impression

 Biophysical profile [DATE] with non-reactive NST
Recommendations

 Ms. Caleb was sent to L&D for Delivery.

## 2019-05-17 ENCOUNTER — Emergency Department (HOSPITAL_COMMUNITY)
Admission: EM | Admit: 2019-05-17 | Discharge: 2019-05-17 | Discharge status: Against Medical Advice | Payer: Medicaid Other | Attending: Emergency Medicine | Admitting: Emergency Medicine

## 2019-05-17 ENCOUNTER — Emergency Department (HOSPITAL_COMMUNITY): Payer: Medicaid Other

## 2019-05-17 DIAGNOSIS — Y999 Unspecified external cause status: Secondary | ICD-10-CM | POA: Insufficient documentation

## 2019-05-17 DIAGNOSIS — Y939 Activity, unspecified: Secondary | ICD-10-CM | POA: Insufficient documentation

## 2019-05-17 DIAGNOSIS — Z5321 Procedure and treatment not carried out due to patient leaving prior to being seen by health care provider: Secondary | ICD-10-CM | POA: Insufficient documentation

## 2019-05-17 DIAGNOSIS — Y929 Unspecified place or not applicable: Secondary | ICD-10-CM | POA: Insufficient documentation

## 2019-05-17 DIAGNOSIS — W260XXA Contact with knife, initial encounter: Secondary | ICD-10-CM | POA: Insufficient documentation

## 2019-05-17 NOTE — ED Triage Notes (Signed)
Pt cut left pinky finger on kitchen knife at 11pm last night. Pt has large laceration wrapped with sterile gauze no bleeding.

## 2019-05-17 NOTE — ED Notes (Signed)
Pt has baby with her so waiting in peds waiting room

## 2019-05-17 NOTE — ED Notes (Signed)
Pt had to leave due to baby being fussy.

## 2020-02-17 ENCOUNTER — Other Ambulatory Visit: Payer: Self-pay

## 2020-02-17 ENCOUNTER — Emergency Department (HOSPITAL_COMMUNITY)
Admission: EM | Admit: 2020-02-17 | Discharge: 2020-02-17 | Disposition: A | Discharge status: Home / Self Care | Payer: Medicaid Other | Attending: Emergency Medicine | Admitting: Emergency Medicine

## 2020-02-17 DIAGNOSIS — Z87891 Personal history of nicotine dependence: Secondary | ICD-10-CM | POA: Insufficient documentation

## 2020-02-17 DIAGNOSIS — Z79899 Other long term (current) drug therapy: Secondary | ICD-10-CM | POA: Insufficient documentation

## 2020-02-17 DIAGNOSIS — K0889 Other specified disorders of teeth and supporting structures: Secondary | ICD-10-CM

## 2020-02-17 LAB — I-STAT BETA HCG BLOOD, ED (MC, WL, AP ONLY): I-stat hCG, quantitative: <5 m[IU]/mL (ref <5)

## 2020-02-17 LAB — CBC WITH DIFFERENTIAL/PLATELET
Abs Immature Granulocytes: 0.05 10*3/uL (ref 0.00–0.07)
Basophils Absolute: 0 10*3/uL (ref 0.0–0.1)
Basophils Relative: 0 %
Eosinophils Absolute: 0.2 10*3/uL (ref 0.0–0.5)
Eosinophils Relative: 2 %
HCT: 33.8 % — ABNORMAL LOW (ref 36.0–46.0)
Hemoglobin: 11.3 g/dL — ABNORMAL LOW (ref 12.0–15.0)
Immature Granulocytes: 1 %
Lymphocytes Relative: 20 %
Lymphs Abs: 1.6 10*3/uL (ref 0.7–4.0)
MCH: 29.4 pg (ref 26.0–34.0)
MCHC: 33.4 g/dL (ref 30.0–36.0)
MCV: 88 fL (ref 80.0–100.0)
Monocytes Absolute: 0.4 10*3/uL (ref 0.1–1.0)
Monocytes Relative: 6 %
Neutro Abs: 5.7 10*3/uL (ref 1.7–7.7)
Neutrophils Relative %: 71 %
Platelets: 275 10*3/uL (ref 150–400)
RBC: 3.84 MIL/uL — ABNORMAL LOW (ref 3.87–5.11)
RDW: 14 % (ref 11.5–15.5)
WBC: 8.1 10*3/uL (ref 4.0–10.5)
nRBC: 0 % (ref 0.0–0.2)

## 2020-02-17 LAB — COMPREHENSIVE METABOLIC PANEL
ALT: 16 U/L (ref 0–44)
AST: 24 U/L (ref 15–41)
Albumin: 3.9 g/dL (ref 3.5–5.0)
Alkaline Phosphatase: 55 U/L (ref 38–126)
Anion gap: 9 (ref 5–15)
BUN: 12 mg/dL (ref 6–20)
CO2: 26 mmol/L (ref 22–32)
Calcium: 9.1 mg/dL (ref 8.9–10.3)
Chloride: 101 mmol/L (ref 98–111)
Creatinine, Ser: 0.7 mg/dL (ref 0.44–1.00)
GFR, Estimated: >60 mL/min (ref >60)
Glucose, Bld: 92 mg/dL (ref 70–99)
Potassium: 4 mmol/L (ref 3.5–5.1)
Sodium: 136 mmol/L (ref 135–145)
Total Bilirubin: 0.5 mg/dL (ref 0.3–1.2)
Total Protein: 6.8 g/dL (ref 6.5–8.1)

## 2020-02-17 MED ORDER — OXYCODONE-ACETAMINOPHEN 5-325 MG PO TABS: 1.0000 | ORAL_TABLET | Freq: Three times a day (TID) | ORAL | 0 refills | Status: DC | PRN

## 2020-02-17 MED ORDER — AMOXICILLIN-POT CLAVULANATE 875-125 MG PO TABS: 1.0000 | ORAL_TABLET | Freq: Two times a day (BID) | ORAL | 0 refills | Status: DC

## 2020-02-17 MED ORDER — OXYCODONE-ACETAMINOPHEN 5-325 MG PO TABS
1.0000 | ORAL_TABLET | Freq: Once | ORAL | Status: AC
  Administered 2020-02-17: 1 via ORAL
  Filled 2020-02-17: qty 1

## 2020-02-17 NOTE — ED Provider Notes (Signed)
[facility] EMERGENCY DEPARTMENT Provider Note   CSN: [id number] Arrival date & time: 02/17/20  0330     History No chief complaint on file.   Robin Cochran is a 34 y.o. female who presents to ED with a chief complaint of left lower dental pain.  Symptoms have been intermittent for the past 3 days but worsened yesterday.  Had improvement up until recently with over-the-counter medication such as Tylenol and ibuprofen.  Is scheduled see her dentist next week.  States that she ate something 3 days ago which chipped an area of the tooth prior to the pain.  She has not seen a dentist in several years.  Denies any neck stiffness, fever, trismus, drooling or shortness of breath.  HPI     Past Medical History:  Diagnosis Date  . Anemia     Patient Active Problem List   Diagnosis Date Noted  . Mild preeclampsia 04/28/2018  . Postpartum care following vaginal delivery 04/21/2018  . Abnormal antibody  12/21/2017  . Encounter for supervision of normal pregnancy, unspecified, unspecified trimester 12/07/2017  . 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 Asymptomatic bacteriuria in pregnancy 09/22/2012  . Unspecified vitamin D deficiency 09/22/2012  . Previous cesarean section 09/18/2012  . Supervision of other normal pregnancy 09/18/2012    Past Surgical History:  Procedure Laterality Date  . CESAREAN SECTION    . TONSILLECTOMY    . TUBAL LIGATION Bilateral 04/19/2018   Procedure: POST PARTUM TUBAL LIGATION;  Surgeon: Federico Flake, MD;  Location: [facility] BIRTHING SUITES;  Service: Gynecology;  Laterality: Bilateral;     OB History    Gravida  3   Para  3   Term  3   Preterm      AB      Living  3     SAB      TAB      Ectopic      Multiple  0   Live Births  3           Family History  Problem Relation Age of Onset  . Arthritis Mother   . Hypertension Father   . Hypertension Paternal Grandmother     Social History   Tobacco Use  .  Smoking status: Former Smoker    Types: E-cigarettes    Quit date: 05/11/2011    Years since quitting: 8.7  . Smokeless tobacco: Never Used  . Tobacco comment: pt states, "I vape sometimes."  Vaping Use  . Vaping Use: Never used  Substance Use Topics  . Alcohol use: Yes  . Drug use: No    Home Medications Prior to Admission medications   Medication Sig Start Date End Date Taking? Authorizing Provider  amLODipine (NORVASC) 10 MG tablet Take 1 tablet (10 mg total) by mouth daily. 05/17/18   Sharyon Cable, CNM  amoxicillin-clavulanate (AUGMENTIN) 875-125 MG tablet Take 1 tablet by mouth every 12 (twelve) hours. 02/17/20   Tarin Navarez, PA-C  ferrous sulfate 325 (65 FE) MG tablet Take 1 tablet (325 mg total) by mouth 2 (two) times daily with a meal. 01/25/18   Brock Bad, MD  omeprazole (PRILOSEC) 20 MG capsule Take 1 capsule (20 mg total) by mouth 2 (two) times daily before a meal. Patient not taking: Reported on 05/17/2018 01/24/18   Brock Bad, MD  oxyCODONE-acetaminophen (PERCOCET/ROXICET) 5-325 MG tablet Take 1 tablet by mouth every 8 (eight) hours as needed for severe pain. 02/17/20   Dietrich Pates, PA-C  Prenatal Vit-Fe Fumarate-FA (PRENATAL MULTIVITAMIN) TABS tablet Take 1 tablet by mouth daily at 12 noon.    [provider]  sertraline (ZOLOFT) 25 MG tablet Take 1 tablet (25 mg total) by mouth daily. 05/17/18   Sharyon Cable, CNM    Allergies    Patient has no known allergies.  Review of Systems   Review of Systems  Constitutional: Negative for chills and fever.  HENT: Positive for dental problem. Negative for postnasal drip and sinus pressure.   Musculoskeletal: Negative for neck pain.    Physical Exam Updated Vital Signs BP (!) 150/91 (BP Location: Right Arm)   Pulse (!) 56   Temp 98.1 F (36.7 C) (Oral)   Resp 16   SpO2 100%   Physical Exam Vitals and nursing note reviewed.  Constitutional:      General: She is not in acute distress.     Appearance: She is well-developed. She is not diaphoretic.  HENT:     Head: Normocephalic and atraumatic.     Mouth/Throat:     Dentition: Abnormal dentition. Does not have dentures. Dental caries present. No dental tenderness, gingival swelling, dental abscesses or gum lesions.      Comments: Some swelling noted around the indicated tooth without fluctuance.  There is a chipped tooth in the area and several missing teeth. No facial, neck or cheek swelling noted. No pooling of secretions or trismus.  Normal voice noted with no difficulty swallowing or breathing.  No submandibular erythema, edema or crepitus noted. Eyes:     General: No scleral icterus.    Conjunctiva/sclera: Conjunctivae normal.  Pulmonary:     Effort: Pulmonary effort is normal. No respiratory distress.  Musculoskeletal:     Cervical back: Normal range of motion.  Skin:    Findings: No rash.  Neurological:     Mental Status: She is alert.     ED Results / Procedures / Treatments   Labs (all labs ordered are listed, but only abnormal results are displayed) Labs Reviewed  CBC WITH DIFFERENTIAL/PLATELET - Abnormal; Notable for the following components:      Result Value   RBC 3.84 (*)    Hemoglobin 11.3 (*)    HCT 33.8 (*)    All other components within normal limits  COMPREHENSIVE METABOLIC PANEL  I-STAT BETA HCG BLOOD, ED ([facility], [facility], [facility] ONLY)    EKG None  Radiology No results found.  Procedures Procedures (including critical care time)  Medications Ordered in ED Medications  oxyCODONE-acetaminophen (PERCOCET/ROXICET) 5-325 MG per tablet 1 tablet (1 tablet Oral Given 02/17/20 0906)    ED Course  I have reviewed the triage vital signs and the nursing notes.  Pertinent labs & imaging results that were available during my care of the patient were reviewed by me and considered in my medical decision making (see chart for details).    MDM Rules/Calculators/A&P                          Patient with  dentalgia. On exam, there is no evidence of a drainable abscess. No trismus, glossal elevation, unilateral tonsillar swelling. No evidence of retropharyngeal or peritonsillar abscess or Ludwig angina. Will treat with  Augmentin and pain medication. Pt instructed to follow-up with dentist as soon as possible. Resource guide provided with AVS.  Patient is hemodynamically stable, in NAD, and able to ambulate in the ED. Evaluation does not show pathology that would require ongoing emergent intervention or  inpatient treatment. I explained the diagnosis to the patient. Pain has been managed and has no complaints prior to discharge. Patient is comfortable with above plan and is stable for discharge at this time. All questions were answered prior to disposition. Strict return precautions for returning to the ED were discussed. Encouraged follow up with PCP.   Prior to providing a prescription for a controlled substance, I independently reviewed the patient's recent prescription history on the [place] Controlled Substance Reporting System. The patient had no recent or regular prescriptions and was deemed appropriate for a brief, less than 3 day prescription of narcotic for acute analgesia.  An After Visit Summary was printed and given to the patient.   Portions of this note were generated with Scientist, clinical (histocompatibility and immunogenetics). Dictation errors may occur despite best attempts at proofreading.  Final Clinical Impression(s) / ED Diagnoses Final diagnoses:  Pain, dental    Rx / DC Orders ED Discharge Orders         Ordered    amoxicillin-clavulanate (AUGMENTIN) 875-125 MG tablet  Every 12 hours        02/17/20 0911    oxyCODONE-acetaminophen (PERCOCET/ROXICET) 5-325 MG tablet  Every 8 hours PRN        02/17/20 0911           Dietrich Pates, PA-C 02/17/20 0915    Alvira Monday, MD 02/19/20 1517

## 2020-02-17 NOTE — Discharge Instructions (Addendum)
Take the antibiotics as prescribed.  Complete the entire course of antibiotics regardless of symptom improvement worsening or recurrence of your infection. Take pain medication as needed. Return to the ER if you start to experience worsening pain or swelling beyond 2 days after taking the antibiotic, trouble breathing, trouble moving your neck or chest pain.

## 2020-02-17 NOTE — ED Triage Notes (Signed)
Pt here with dental pain bottom left side that started x3days ago. Pt has dental apt Friday. Denies fever. +n/v . Pt states that she has been taking tylenol and ibuprofen without relief.

## 2020-07-22 ENCOUNTER — Encounter (HOSPITAL_COMMUNITY): Payer: Self-pay

## 2020-07-22 ENCOUNTER — Emergency Department (HOSPITAL_COMMUNITY): Payer: Medicaid Other

## 2020-07-22 ENCOUNTER — Emergency Department (HOSPITAL_COMMUNITY)
Admission: EM | Admit: 2020-07-22 | Discharge: 2020-07-23 | Disposition: A | Discharge status: Home / Self Care | Payer: Medicaid Other | Attending: Emergency Medicine | Admitting: Emergency Medicine

## 2020-07-22 DIAGNOSIS — M25511 Pain in right shoulder: Secondary | ICD-10-CM | POA: Diagnosis not present

## 2020-07-22 DIAGNOSIS — M546 Pain in thoracic spine: Secondary | ICD-10-CM | POA: Diagnosis not present

## 2020-07-22 DIAGNOSIS — Z87891 Personal history of nicotine dependence: Secondary | ICD-10-CM | POA: Insufficient documentation

## 2020-07-22 DIAGNOSIS — R0789 Other chest pain: Secondary | ICD-10-CM | POA: Insufficient documentation

## 2020-07-22 DIAGNOSIS — M7918 Myalgia, other site: Secondary | ICD-10-CM

## 2020-07-22 LAB — CBC
HCT: 35.6 % — ABNORMAL LOW (ref 36.0–46.0)
Hemoglobin: 11.6 g/dL — ABNORMAL LOW (ref 12.0–15.0)
MCH: 29.7 pg (ref 26.0–34.0)
MCHC: 32.6 g/dL (ref 30.0–36.0)
MCV: 91.3 fL (ref 80.0–100.0)
Platelets: 282 10*3/uL (ref 150–400)
RBC: 3.9 MIL/uL (ref 3.87–5.11)
RDW: 14.1 % (ref 11.5–15.5)
WBC: 6.1 10*3/uL (ref 4.0–10.5)
nRBC: 0 % (ref 0.0–0.2)

## 2020-07-22 LAB — I-STAT BETA HCG BLOOD, ED (MC, WL, AP ONLY): I-stat hCG, quantitative: <5 m[IU]/mL (ref <5)

## 2020-07-22 NOTE — ED Triage Notes (Signed)
Pt reports chest pain and back pain starting 1 day ago with nausea. No cardiac hx, Pt states she thought sge slept bad however the pain did not go away

## 2020-07-23 ENCOUNTER — Other Ambulatory Visit: Payer: Self-pay

## 2020-07-23 LAB — BASIC METABOLIC PANEL
Anion gap: 6 (ref 5–15)
BUN: 8 mg/dL (ref 6–20)
CO2: 28 mmol/L (ref 22–32)
Calcium: 9.1 mg/dL (ref 8.9–10.3)
Chloride: 101 mmol/L (ref 98–111)
Creatinine, Ser: 0.9 mg/dL (ref 0.44–1.00)
GFR, Estimated: >60 mL/min (ref >60)
Glucose, Bld: 85 mg/dL (ref 70–99)
Potassium: 3.7 mmol/L (ref 3.5–5.1)
Sodium: 135 mmol/L (ref 135–145)

## 2020-07-23 LAB — TROPONIN I (HIGH SENSITIVITY): Troponin I (High Sensitivity): 4 ng/L (ref <18)

## 2020-07-23 MED ORDER — METHOCARBAMOL 500 MG PO TABS: 500.0000 mg | ORAL_TABLET | Freq: Two times a day (BID) | ORAL | 0 refills | Status: DC

## 2020-07-23 MED ORDER — METHOCARBAMOL 500 MG PO TABS
500.0000 mg | ORAL_TABLET | Freq: Once | ORAL | Status: AC
  Administered 2020-07-23: 500 mg via ORAL
  Filled 2020-07-23: qty 1

## 2020-07-23 MED ORDER — IBUPROFEN 600 MG PO TABS: 600.0000 mg | ORAL_TABLET | Freq: Four times a day (QID) | ORAL | 0 refills | Status: DC | PRN

## 2020-07-23 MED ORDER — IBUPROFEN 400 MG PO TABS
600.0000 mg | ORAL_TABLET | Freq: Once | ORAL | Status: AC
  Administered 2020-07-23: 600 mg via ORAL
  Filled 2020-07-23: qty 1

## 2020-07-23 NOTE — ED Notes (Signed)
ED Provider at bedside. 

## 2020-07-23 NOTE — ED Provider Notes (Signed)
[facility] EMERGENCY DEPARTMENT Provider Note   CSN: [id number] Arrival date & time: 07/22/20  2247     History Chief Complaint  Patient presents with  . Chest Pain    Robin Cochran is a 35 y.o. female.  Patient to ED for evaluation of pain in the right shoulder and anterior chest for the past 2-3 days. No injury or trauma. No cough, fever, SOB. No neck pain, weakness or numbness of UE's. She states the pain is constant but worse at times than others.   The history is provided by the patient. No language interpreter was used.  Chest Pain Associated symptoms: no abdominal pain, no cough, no fever, no nausea, no numbness, no shortness of breath, no vomiting and no weakness        Past Medical History:  Diagnosis Date  . Anemia     Patient Active Problem List   Diagnosis Date Noted  . Mild preeclampsia 04/28/2018  . Postpartum care following vaginal delivery 04/21/2018  . Abnormal antibody  12/21/2017  . Encounter for supervision of normal pregnancy, unspecified, unspecified trimester 12/07/2017  . 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 Asymptomatic bacteriuria in pregnancy 09/22/2012  . Unspecified vitamin D deficiency 09/22/2012  . Previous cesarean section 09/18/2012  . Supervision of other normal pregnancy 09/18/2012    Past Surgical History:  Procedure Laterality Date  . CESAREAN SECTION    . TONSILLECTOMY    . TUBAL LIGATION Bilateral 04/19/2018   Procedure: POST PARTUM TUBAL LIGATION;  Surgeon: Federico Flake, MD;  Location: [facility] BIRTHING SUITES;  Service: Gynecology;  Laterality: Bilateral;     OB History    Gravida  3   Para  3   Term  3   Preterm      AB      Living  3     SAB      IAB      Ectopic      Multiple  0   Live Births  3           Family History  Problem Relation Age of Onset  . Arthritis Mother   . Hypertension Father   . Hypertension Paternal Grandmother     Social History   Tobacco Use  .  Smoking status: Former Smoker    Types: E-cigarettes    Quit date: 05/11/2011    Years since quitting: 9.2  . Smokeless tobacco: Never Used  . Tobacco comment: pt states, "I vape sometimes."  Vaping Use  . Vaping Use: Never used  Substance Use Topics  . Alcohol use: Yes  . Drug use: No    Home Medications Prior to Admission medications   Medication Sig Start Date End Date Taking? Authorizing Provider  amLODipine (NORVASC) 10 MG tablet Take 1 tablet (10 mg total) by mouth daily. 05/17/18   Sharyon Cable, CNM  amoxicillin-clavulanate (AUGMENTIN) 875-125 MG tablet Take 1 tablet by mouth every 12 (twelve) hours. 02/17/20   Khatri, Hina, PA-C  ferrous sulfate 325 (65 FE) MG tablet Take 1 tablet (325 mg total) by mouth 2 (two) times daily with a meal. 01/25/18   Brock Bad, MD  omeprazole (PRILOSEC) 20 MG capsule Take 1 capsule (20 mg total) by mouth 2 (two) times daily before a meal. Patient not taking: Reported on 05/17/2018 01/24/18   Brock Bad, MD  oxyCODONE-acetaminophen (PERCOCET/ROXICET) 5-325 MG tablet Take 1 tablet by mouth every 8 (eight) hours as needed for severe pain. 02/17/20  Khatri, Hina, PA-C  Prenatal Vit-Fe Fumarate-FA (PRENATAL MULTIVITAMIN) TABS tablet Take 1 tablet by mouth daily at 12 noon.    [provider]  sertraline (ZOLOFT) 25 MG tablet Take 1 tablet (25 mg total) by mouth daily. 05/17/18   Sharyon Cable, CNM    Allergies    Patient has no known allergies.  Review of Systems   Review of Systems  Constitutional: Negative for chills and fever.  HENT: Negative.  Negative for congestion.   Respiratory: Negative.  Negative for cough and shortness of breath.   Cardiovascular: Positive for chest pain.  Gastrointestinal: Negative.  Negative for abdominal pain, nausea and vomiting.  Musculoskeletal: Negative for neck pain.       See HPI.  Skin: Negative.   Neurological: Negative.  Negative for weakness and numbness.    Physical  Exam Updated Vital Signs BP 123/84 (BP Location: Right Arm)   Pulse 77   Temp 98.1 F (36.7 C) (Oral)   Resp 20   Ht 5\' 6"  (1.676 m)   Wt 81.2 kg   SpO2 100%   BMI 28.89 kg/m   Physical Exam Vitals and nursing note reviewed.  Constitutional:      Appearance: She is well-developed.  HENT:     Head: Normocephalic.  Cardiovascular:     Rate and Rhythm: Normal rate and regular rhythm.  Pulmonary:     Effort: Pulmonary effort is normal.     Breath sounds: Normal breath sounds. No wheezing, rhonchi or rales.  Chest:     Chest wall: No tenderness.  Abdominal:     General: Bowel sounds are normal.     Palpations: Abdomen is soft.     Tenderness: There is no abdominal tenderness. There is no guarding or rebound.  Musculoskeletal:        General: Normal range of motion.     Cervical back: Normal range of motion and neck supple.       Back:     Comments: Tender to right parascapular back. FROM of UE's without limitation. Grip strength is 5/5 and equal in bilateral UE's. No midline cervical or paracervical tenderness.   Skin:    General: Skin is warm and dry.     Findings: No rash.  Neurological:     General: No focal deficit present.     Mental Status: She is alert.     Sensory: No sensory deficit.     Deep Tendon Reflexes: Reflexes normal.     ED Results / Procedures / Treatments   Labs (all labs ordered are listed, but only abnormal results are displayed) Labs Reviewed  CBC - Abnormal; Notable for the following components:      Result Value   Hemoglobin 11.6 (*)    HCT 35.6 (*)    All other components within normal limits  BASIC METABOLIC PANEL  I-STAT BETA HCG BLOOD, ED ([facility], [facility], [facility] ONLY)  TROPONIN I (HIGH SENSITIVITY)    EKG None  Radiology DG Chest 2 View  Result Date: 07/22/2020 CLINICAL DATA:  Chest and back pain for 1 day, nausea EXAM: CHEST - 2 VIEW COMPARISON:  None. FINDINGS: The heart size and mediastinal contours are within normal limits. Both lungs  are clear. The visualized skeletal structures are unremarkable. IMPRESSION: No active cardiopulmonary disease. Electronically Signed   By: 07/24/2020 M.D.   On: 07/22/2020 23:22    Procedures Procedures   Medications Ordered in ED Medications - No data to display  ED Course  I have reviewed the triage vital signs and the nursing notes.  Pertinent labs & imaging results that were available during my care of the patient were reviewed by me and considered in my medical decision making (see chart for details).    MDM Rules/Calculators/A&P                          Patient to ED with right shoulder pain with some right upper chest discomfort.   The patient has no respiratory symptoms, including no pleuritic pain, cough, SOB. She denies fever. Pain is worse with palpation. Labs are essentially normal. CXR clear. EKG NSR. Doubt infection, ACS, dissection, PE. Pain is reproducible to palpation suggesting musculoskeletal pain.   Will treat with Robaxin and ibuprofen. Recommend PCP or orthopedic follow up.   Final Clinical Impression(s) / ED Diagnoses Final diagnoses:  None   1. Right shoulder pain 2. Musculoskeletal pain  Rx / DC Orders ED Discharge Orders    None       Elpidio Anis, Cordelia Poche 07/23/20 0038    Zadie Rhine, MD 07/23/20 [phone]

## 2020-07-23 NOTE — Discharge Instructions (Signed)
Take medications as prescribed. Use warm compresses as directed for further pain relief.   Follow up with primary care. If you do not have a primary care provider, you can call the [facility] for an appointment. Alternatively, you can follow up with orthopedics if symptoms persist.

## 2020-12-31 ENCOUNTER — Emergency Department (HOSPITAL_COMMUNITY): Payer: Medicaid Other

## 2020-12-31 ENCOUNTER — Inpatient Hospital Stay (HOSPITAL_COMMUNITY): Payer: Medicaid Other

## 2020-12-31 ENCOUNTER — Inpatient Hospital Stay (HOSPITAL_COMMUNITY)
Admission: EM | Admit: 2020-12-31 | Discharge: 2021-01-02 | DRG: 177 | Disposition: A | Discharge status: Home / Self Care | Payer: Medicaid Other | Attending: Family Medicine | Admitting: Family Medicine

## 2020-12-31 ENCOUNTER — Encounter (HOSPITAL_COMMUNITY): Payer: Self-pay

## 2020-12-31 ENCOUNTER — Other Ambulatory Visit: Payer: Self-pay

## 2020-12-31 DIAGNOSIS — J189 Pneumonia, unspecified organism: Secondary | ICD-10-CM | POA: Diagnosis present

## 2020-12-31 DIAGNOSIS — U071 COVID-19: Secondary | ICD-10-CM | POA: Diagnosis present

## 2020-12-31 DIAGNOSIS — T50901A Poisoning by unspecified drugs, medicaments and biological substances, accidental (unintentional), initial encounter: Secondary | ICD-10-CM | POA: Diagnosis present

## 2020-12-31 DIAGNOSIS — F1210 Cannabis abuse, uncomplicated: Secondary | ICD-10-CM | POA: Diagnosis present

## 2020-12-31 DIAGNOSIS — R55 Syncope and collapse: Secondary | ICD-10-CM | POA: Diagnosis present

## 2020-12-31 DIAGNOSIS — J9600 Acute respiratory failure, unspecified whether with hypoxia or hypercapnia: Secondary | ICD-10-CM | POA: Diagnosis present

## 2020-12-31 DIAGNOSIS — J1282 Pneumonia due to coronavirus disease 2019: Secondary | ICD-10-CM | POA: Diagnosis present

## 2020-12-31 DIAGNOSIS — D649 Anemia, unspecified: Secondary | ICD-10-CM | POA: Diagnosis present

## 2020-12-31 DIAGNOSIS — Z8249 Family history of ischemic heart disease and other diseases of the circulatory system: Secondary | ICD-10-CM

## 2020-12-31 DIAGNOSIS — J690 Pneumonitis due to inhalation of food and vomit: Secondary | ICD-10-CM | POA: Diagnosis present

## 2020-12-31 DIAGNOSIS — R0602 Shortness of breath: Secondary | ICD-10-CM

## 2020-12-31 DIAGNOSIS — Z8261 Family history of arthritis: Secondary | ICD-10-CM

## 2020-12-31 DIAGNOSIS — Z9851 Tubal ligation status: Secondary | ICD-10-CM

## 2020-12-31 DIAGNOSIS — R042 Hemoptysis: Secondary | ICD-10-CM | POA: Diagnosis present

## 2020-12-31 DIAGNOSIS — J9601 Acute respiratory failure with hypoxia: Secondary | ICD-10-CM | POA: Diagnosis present

## 2020-12-31 LAB — CBC WITH DIFFERENTIAL/PLATELET
Abs Immature Granulocytes: 0.03 10*3/uL (ref 0.00–0.07)
Basophils Absolute: 0 10*3/uL (ref 0.0–0.1)
Basophils Relative: 0 %
Eosinophils Absolute: 0.1 10*3/uL (ref 0.0–0.5)
Eosinophils Relative: 1 %
HCT: 42.4 % (ref 36.0–46.0)
Hemoglobin: 13.7 g/dL (ref 12.0–15.0)
Immature Granulocytes: 0 %
Lymphocytes Relative: 23 %
Lymphs Abs: 2 10*3/uL (ref 0.7–4.0)
MCH: 30 pg (ref 26.0–34.0)
MCHC: 32.3 g/dL (ref 30.0–36.0)
MCV: 93 fL (ref 80.0–100.0)
Monocytes Absolute: 0.3 10*3/uL (ref 0.1–1.0)
Monocytes Relative: 4 %
Neutro Abs: 6.5 10*3/uL (ref 1.7–7.7)
Neutrophils Relative %: 72 %
Platelets: 292 10*3/uL (ref 150–400)
RBC: 4.56 MIL/uL (ref 3.87–5.11)
RDW: 14.1 % (ref 11.5–15.5)
WBC: 9 10*3/uL (ref 4.0–10.5)
nRBC: 0 % (ref 0.0–0.2)

## 2020-12-31 LAB — ACETAMINOPHEN LEVEL: Acetaminophen (Tylenol), Serum: <10 ug/mL — ABNORMAL LOW (ref 10–30)

## 2020-12-31 LAB — I-STAT ARTERIAL BLOOD GAS, ED
Acid-base deficit: 1 mmol/L (ref 0.0–2.0)
Bicarbonate: 24.8 mmol/L (ref 20.0–28.0)
Calcium, Ion: 1.23 mmol/L (ref 1.15–1.40)
HCT: 40 % (ref 36.0–46.0)
Hemoglobin: 13.6 g/dL (ref 12.0–15.0)
O2 Saturation: 86 %
Patient temperature: 97.8
Potassium: 3.5 mmol/L (ref 3.5–5.1)
Sodium: 136 mmol/L (ref 135–145)
TCO2: 26 mmol/L (ref 22–32)
pCO2 arterial: 41.8 mmHg (ref 32.0–48.0)
pH, Arterial: 7.38 (ref 7.350–7.450)
pO2, Arterial: 51 mmHg — ABNORMAL LOW (ref 83.0–108.0)

## 2020-12-31 LAB — POC SARS CORONAVIRUS 2 AG -  ED: SARSCOV2ONAVIRUS 2 AG: NEGATIVE

## 2020-12-31 LAB — LACTIC ACID, PLASMA
Lactic Acid, Venous: 1.3 mmol/L (ref 0.5–1.9)
Lactic Acid, Venous: 1.6 mmol/L (ref 0.5–1.9)

## 2020-12-31 LAB — TYPE AND SCREEN
ABO/RH(D): O POS
Antibody Screen: NEGATIVE

## 2020-12-31 LAB — COMPREHENSIVE METABOLIC PANEL
ALT: 13 U/L (ref 0–44)
AST: 33 U/L (ref 15–41)
Albumin: 3.8 g/dL (ref 3.5–5.0)
Alkaline Phosphatase: 55 U/L (ref 38–126)
Anion gap: 13 (ref 5–15)
BUN: 10 mg/dL (ref 6–20)
CO2: 24 mmol/L (ref 22–32)
Calcium: 9 mg/dL (ref 8.9–10.3)
Chloride: 101 mmol/L (ref 98–111)
Creatinine, Ser: 0.96 mg/dL (ref 0.44–1.00)
GFR, Estimated: >60 mL/min (ref >60)
Glucose, Bld: 103 mg/dL — ABNORMAL HIGH (ref 70–99)
Potassium: 3.8 mmol/L (ref 3.5–5.1)
Sodium: 138 mmol/L (ref 135–145)
Total Bilirubin: 0.7 mg/dL (ref 0.3–1.2)
Total Protein: 6.6 g/dL (ref 6.5–8.1)

## 2020-12-31 LAB — PROCALCITONIN: Procalcitonin: 0.12 ng/mL

## 2020-12-31 LAB — RESP PANEL BY RT-PCR (FLU A&B, COVID) ARPGX2
Influenza A by PCR: NEGATIVE
Influenza B by PCR: NEGATIVE
SARS Coronavirus 2 by RT PCR: POSITIVE — AB

## 2020-12-31 LAB — ETHANOL: Alcohol, Ethyl (B): <10 mg/dL (ref <10)

## 2020-12-31 LAB — CBG MONITORING, ED: Glucose-Capillary: 102 mg/dL — ABNORMAL HIGH (ref 70–99)

## 2020-12-31 LAB — C-REACTIVE PROTEIN: CRP: <0.5 mg/dL (ref <1.0)

## 2020-12-31 LAB — I-STAT BETA HCG BLOOD, ED (MC, WL, AP ONLY): I-stat hCG, quantitative: <5 m[IU]/mL (ref <5)

## 2020-12-31 LAB — D-DIMER, QUANTITATIVE: D-Dimer, Quant: 3.46 ug/mL-FEU — ABNORMAL HIGH (ref 0.00–0.50)

## 2020-12-31 LAB — TRIGLYCERIDES: Triglycerides: 65 mg/dL (ref <150)

## 2020-12-31 LAB — FERRITIN: Ferritin: 44 ng/mL (ref 11–307)

## 2020-12-31 LAB — LACTATE DEHYDROGENASE: LDH: 181 U/L (ref 98–192)

## 2020-12-31 LAB — SALICYLATE LEVEL: Salicylate Lvl: <7 mg/dL — ABNORMAL LOW (ref 7.0–30.0)

## 2020-12-31 LAB — FIBRINOGEN: Fibrinogen: 309 mg/dL (ref 210–475)

## 2020-12-31 MED ORDER — DEXAMETHASONE SODIUM PHOSPHATE 10 MG/ML IJ SOLN
10.0000 mg | Freq: Once | INTRAMUSCULAR | Status: AC
  Administered 2020-12-31: 10 mg via INTRAVENOUS
  Filled 2020-12-31: qty 1

## 2020-12-31 MED ORDER — PIPERACILLIN-TAZOBACTAM 3.375 G IVPB 30 MIN
3.3750 g | Freq: Once | INTRAVENOUS | Status: AC
  Administered 2020-12-31: 3.375 g via INTRAVENOUS
  Filled 2020-12-31: qty 50

## 2020-12-31 MED ORDER — SODIUM CHLORIDE 0.9 % IV SOLN
100.0000 mg | Freq: Every day | INTRAVENOUS | Status: DC
  Administered 2021-01-01 – 2021-01-02 (×2): 100 mg via INTRAVENOUS
  Filled 2020-12-31: qty 20
  Filled 2020-12-31: qty 100

## 2020-12-31 MED ORDER — IOHEXOL 350 MG/ML SOLN
65.0000 mL | Freq: Once | INTRAVENOUS | Status: AC | PRN
  Administered 2020-12-31: 65 mL via INTRAVENOUS

## 2020-12-31 MED ORDER — SODIUM CHLORIDE 0.9 % IV SOLN
200.0000 mg | Freq: Once | INTRAVENOUS | Status: AC
  Administered 2021-01-01: 200 mg via INTRAVENOUS
  Filled 2020-12-31: qty 40

## 2020-12-31 MED ORDER — ACETAMINOPHEN 325 MG PO TABS: 650.0000 mg | ORAL_TABLET | Freq: Four times a day (QID) | ORAL | Status: DC | PRN

## 2020-12-31 MED ORDER — SODIUM CHLORIDE 0.9 % IV SOLN
500.0000 mg | INTRAVENOUS | Status: DC
  Administered 2021-01-01: 500 mg via INTRAVENOUS
  Filled 2020-12-31: qty 500

## 2020-12-31 MED ORDER — ACETAMINOPHEN 650 MG RE SUPP: 650.0000 mg | Freq: Four times a day (QID) | RECTAL | Status: DC | PRN

## 2020-12-31 MED ORDER — PIPERACILLIN-TAZOBACTAM 3.375 G IVPB
3.3750 g | Freq: Three times a day (TID) | INTRAVENOUS | Status: DC
  Administered 2021-01-01: 3.375 g via INTRAVENOUS
  Filled 2020-12-31: qty 50

## 2020-12-31 MED ORDER — DEXAMETHASONE SODIUM PHOSPHATE 10 MG/ML IJ SOLN
6.0000 mg | INTRAMUSCULAR | Status: DC
Start: 2021-01-01 — End: 2021-01-01

## 2020-12-31 NOTE — ED Notes (Signed)
Portable Xray at bedside.

## 2020-12-31 NOTE — H&P (Signed)
History and Physical    Robin Cochran ZOX:[id number] DOB: [birth year] DOA: 12/31/2020  PCP: Patient, No Pcp Per (Inactive)  Patient coming from: Home.  Chief Complaint: Unresponsive episode.  HPI: Robin Cochran is a 35 y.o. female with no significant past medical history was brought to the ER after patient's son found patient was unresponsive.  As per the report patient's pupils were found to be pinpoint was given Narcan with which patient became more alert awake.  Patient stated that she does take marijuana but does not use any other drugs or take any other medications.  She did state that over the last couple of days she has been getting more cough with shortness of breath.  In the ER she did cough up some blood.  Patient states that is new.  Denies any chest pain.  Has recently traveled to [place] but did not notice any leg swelling.  No sick contacts at home.  ED Course: In the ER patient was hypoxic requiring 5 L oxygen.  COVID test was positive.  CBC and metabolic panel unremarkable.  EKG shows sinus rhythm.  CT angiogram of the chest was done because of elevated D-dimer which shows multifocal pneumonia negative for pulm embolism.  Patient is started on empiric antibiotics for COVID 19 infection and also for community-acquired pneumonia.  Admitted for acute respiratory failure with hypoxia.  Review of Systems: As per HPI, rest all negative.   Past Medical History:  Diagnosis Date   Anemia     Past Surgical History:  Procedure Laterality Date   CESAREAN SECTION     TONSILLECTOMY     TUBAL LIGATION Bilateral 04/19/2018   Procedure: POST PARTUM TUBAL LIGATION;  Surgeon: Federico FlakeNewton, Auriana Niles, MD;  [facility];  Service: Gynecology;  Laterality: Bilateral;     reports that she quit smoking about 9 years ago. Her smoking use included e-cigarettes. She has never used smokeless tobacco. She reports current alcohol use.  Drug: Marijuana.  Not on File  Family  History  Problem Relation Age of Onset   Arthritis Mother    Hypertension Father    Hypertension Paternal Grandmother     Prior to Admission medications   Medication Sig Start Date End Date Taking? Authorizing Provider  amLODipine (NORVASC) 10 MG tablet Take 1 tablet (10 mg total) by mouth daily. 05/17/18   Sharyon Cableogers, Veronica C, CNM  amoxicillin-clavulanate (AUGMENTIN) 875-125 MG tablet Take 1 tablet by mouth every 12 (twelve) hours. 02/17/20   Khatri, Hina, PA-C  ferrous sulfate 325 (65 FE) MG tablet Take 1 tablet (325 mg total) by mouth 2 (two) times daily with a meal. 01/25/18   Brock BadHarper, Charles A, MD  ibuprofen (ADVIL) 600 MG tablet Take 1 tablet (600 mg total) by mouth every 6 (six) hours as needed. 07/23/20   Elpidio AnisUpstill, Shari, PA-C  methocarbamol (ROBAXIN) 500 MG tablet Take 1 tablet (500 mg total) by mouth 2 (two) times daily. 07/23/20   Elpidio AnisUpstill, Shari, PA-C  omeprazole (PRILOSEC) 20 MG capsule Take 1 capsule (20 mg total) by mouth 2 (two) times daily before a meal. Patient not taking: Reported on 05/17/2018 01/24/18   Brock BadHarper, Charles A, MD  oxyCODONE-acetaminophen (PERCOCET/ROXICET) 5-325 MG tablet Take 1 tablet by mouth every 8 (eight) hours as needed for severe pain. 02/17/20   Khatri, Hillary BowHina, PA-C  Prenatal Vit-Fe Fumarate-FA (PRENATAL MULTIVITAMIN) TABS tablet Take 1 tablet by mouth daily at 12 noon.    [provider]  sertraline (ZOLOFT) 25 MG tablet Take 1  tablet (25 mg total) by mouth daily. 05/17/18   Sharyon Cable, CNM    Physical Exam: Constitutional: Moderately built and nourished. Vitals:   12/31/20 1937 12/31/20 2000 12/31/20 2034 12/31/20 2130  BP: (!) 158/97 (!) 148/95 (!) 145/89 (!) 149/94  Pulse: 92 91 76 83  Resp: 17 (!) 22 (!) 25 19  Temp:      TempSrc:      SpO2: 94% (!) 89% 92% 92%   Eyes: Anicteric no pallor. ENMT: No discharge from the ears eyes nose and mouth.  No tongue bite. Neck: No mass felt.  No neck rigidity. Respiratory: No rhonchi or  crepitations. Cardiovascular: S1-S2 heard. Abdomen: Soft nontender bowel sounds present. Musculoskeletal: No edema. Skin: No rash. Neurologic: Alert awake oriented to time place and person.  Moves all extremities. Psychiatric: Denies any suicidal ideation.   Labs on Admission: I have personally reviewed following labs and imaging studies  CBC: Recent Labs  Lab 12/31/20 1701 12/31/20 2126  WBC 9.0  --   NEUTROABS 6.5  --   HGB 13.7 13.6  HCT 42.4 40.0  MCV 93.0  --   PLT 292  --    Basic Metabolic Panel: Recent Labs  Lab 12/31/20 1701 12/31/20 2126  NA 138 136  K 3.8 3.5  CL 101  --   CO2 24  --   GLUCOSE 103*  --   BUN 10  --   CREATININE 0.96  --   CALCIUM 9.0  --    GFR: CrCl cannot be calculated (Unknown ideal weight.). Liver Function Tests: Recent Labs  Lab 12/31/20 1701  AST 33  ALT 13  ALKPHOS 55  BILITOT 0.7  PROT 6.6  ALBUMIN 3.8   No results for input(s): LIPASE, AMYLASE in the last 168 hours. No results for input(s): AMMONIA in the last 168 hours. Coagulation Profile: No results for input(s): INR, PROTIME in the last 168 hours. Cardiac Enzymes: No results for input(s): CKTOTAL, CKMB, CKMBINDEX, TROPONINI in the last 168 hours. BNP (last 3 results) No results for input(s): PROBNP in the last 8760 hours. HbA1C: No results for input(s): HGBA1C in the last 72 hours. CBG: Recent Labs  Lab 12/31/20 1712  GLUCAP 102*   Lipid Profile: Recent Labs    12/31/20 2021  TRIG 65   Thyroid Function Tests: No results for input(s): TSH, T4TOTAL, FREET4, T3FREE, THYROIDAB in the last 72 hours. Anemia Panel: No results for input(s): VITAMINB12, FOLATE, FERRITIN, TIBC, IRON, RETICCTPCT in the last 72 hours. Urine analysis:    Component Value Date/Time   COLORURINE YELLOW 03/14/2018 2012   APPEARANCEUR HAZY (A) 03/14/2018 2012   LABSPEC 1.023 03/14/2018 2012   PHURINE 6.0 03/14/2018 2012   GLUCOSEU NEGATIVE 03/14/2018 2012   HGBUR NEGATIVE  03/14/2018 2012   BILIRUBINUR NEGATIVE 03/14/2018 2012   KETONESUR NEGATIVE 03/14/2018 2012   PROTEINUR NEGATIVE 03/14/2018 2012   UROBILINOGEN 0.2 08/24/2012 1829   NITRITE NEGATIVE 03/14/2018 2012   LEUKOCYTESUR NEGATIVE 03/14/2018 2012   Sepsis Labs: @LABRCNTIP (procalcitonin:4,lacticidven:4) ) Recent Results (from the past 240 hour(s))  Resp Panel by RT-PCR (Flu A&B, Covid) Nasopharyngeal Swab     Status: Abnormal   Collection Time: 12/31/20  6:02 PM   Specimen: Nasopharyngeal Swab; Nasopharyngeal(NP) swabs in vial transport medium  Result Value Ref Range Status   SARS Coronavirus 2 by RT PCR POSITIVE (A) NEGATIVE Final    Comment: RESULT CALLED TO, READ BACK BY AND VERIFIED WITH08/26/22 RN Henrene Pastor 12/31/20 A BROWNING (NOTE)  SARS-CoV-2 target nucleic acids are DETECTED.  The SARS-CoV-2 RNA is generally detectable in upper respiratory specimens during the acute phase of infection. Positive results are indicative of the presence of the identified virus, but do not rule out bacterial infection or co-infection with other pathogens not detected by the test. Clinical correlation with patient history and other diagnostic information is necessary to determine patient infection status. The expected result is Negative.  Fact Sheet for Patients: BloggerCourse.com  Fact Sheet for Healthcare Providers: SeriousBroker.it  This test is not yet approved or cleared by the [place] FDA and  has been authorized for detection and/or diagnosis of SARS-CoV-2 by FDA under an Emergency Use Authorization (EUA).  This EUA will remain in effect (meaning this test  can be used) for the duration of  the COVID-19 declaration under Section 564(b)(1) of the Act, 21 U.S.C. section 360bbb-3(b)(1), unless the authorization is terminated or revoked sooner.     Influenza A by PCR NEGATIVE NEGATIVE Final   Influenza B by PCR NEGATIVE NEGATIVE Final     Comment: (NOTE) The Xpert Xpress SARS-CoV-2/FLU/RSV plus assay is intended as an aid in the diagnosis of influenza from Nasopharyngeal swab specimens and should not be used as a sole basis for treatment. Nasal washings and aspirates are unacceptable for Xpert Xpress SARS-CoV-2/FLU/RSV testing.  Fact Sheet for Patients: BloggerCourse.com  Fact Sheet for Healthcare Providers: SeriousBroker.it  This test is not yet approved or cleared by the [place] FDA and has been authorized for detection and/or diagnosis of SARS-CoV-2 by FDA under an Emergency Use Authorization (EUA). This EUA will remain in effect (meaning this test can be used) for the duration of the COVID-19 declaration under Section 564(b)(1) of the Act, 21 U.S.C. section 360bbb-3(b)(1), unless the authorization is terminated or revoked.  Performed at [facility], [address], [place], [place] [address]      Radiological Exams on Admission: CT Angio Chest PE W and/or Wo Contrast  Result Date: 12/31/2020 CLINICAL DATA:  PE suspected, high prob EXAM: CT ANGIOGRAPHY CHEST WITH CONTRAST TECHNIQUE: Multidetector CT imaging of the chest was performed using the standard protocol during bolus administration of intravenous contrast. Multiplanar CT image reconstructions and MIPs were obtained to evaluate the vascular anatomy. CONTRAST:  62mL OMNIPAQUE IOHEXOL 350 MG/ML SOLN COMPARISON:  None. FINDINGS: Cardiovascular: Suboptimal opacification of the pulmonary arteries. No large or central pulmonary emboli noted. Heart is normal size. Aorta is normal caliber. Mediastinum/Nodes: No mediastinal, hilar, or axillary adenopathy. Trachea and esophagus are unremarkable. Thyroid unremarkable. Lungs/Pleura: Extensive bilateral airspace disease, left greater than right. No effusions. Upper Abdomen: Imaging into the upper abdomen demonstrates no acute findings. Musculoskeletal: Chest wall  soft tissues are unremarkable. No acute bony abnormality. Review of the MIP images confirms the above findings. IMPRESSION: No large or central pulmonary emboli. Suboptimal opacification of the peripheral vessels. Extensive bilateral airspace disease most compatible with multifocal pneumonia. Electronically Signed   By: Charlett Nose M.D.   On: 12/31/2020 21:22   DG Chest Port 1 View  Result Date: 12/31/2020 CLINICAL DATA:  Shortness of breath.  Found unresponsive by family. EXAM: PORTABLE CHEST 1 VIEW COMPARISON:  07/22/2020 FINDINGS: Heart size and mediastinal contours are normal. No signs of pleural effusion. There are diffuse airspace opacities identified throughout the left lung and right upper lobe. Visualized osseous structures are unremarkable. IMPRESSION: Bilateral airspace opacities compatible with multifocal infection. Electronically Signed   By: Signa Kell M.D.   On: 12/31/2020 17:39  EKG: Independently reviewed.  Normal sinus rhythm with QTC of 6 ms.  Assessment/Plan Principal Problem:   Acute respiratory failure due to COVID-19 [facility]) Active Problems:   Multifocal pneumonia    Acute respiratory failure with hypoxia presently on 5 L oxygen with multifocal pneumonia for which patient is positive for COVID-19 infection is being placed on Decadron and remdesivir.  We will also treat with empiric antibiotics for community-acquired pneumonia.  Get sputum cultures.  Follow-up blood cultures.  Follow inflammatory markers.  Patient may need a baricitinib or Actemra if patient requires more oxygen. Hemoptysis cause not clear could be from pneumonia.  No obvious tongue bite seen.  We will closely monitor. Syncope could be from hypoxia.  Closely monitor in telemetry check 2D echo.  Follow urine drug screen.  Since patient is acute respiratory failure with hypoxia with multifocal pneumonia and COVID infection will need close monitoring for any further worsening and inpatient status.   DVT  prophylaxis: SCDs.  For now I am avoiding anticoagulation since patient has active mopped assist.  If there is no further hemoptysis may start patient on pharmacological DVT prophylaxis. Code Status: Full code. Family Communication: Discussed with patient. Disposition Plan: Home. Consults called: None. Admission status: Inpatient.   Eduard Clos MD [facility] Pager [phone].  If 7PM-7AM, please contact night-coverage [email or web address] Password [facility]  12/31/2020, 9:57 PM

## 2020-12-31 NOTE — ED Provider Notes (Signed)
[facility] EMERGENCY DEPARTMENT Provider Note   CSN: [id number] Arrival date & time: 12/31/20  1642     History Chief Complaint  Patient presents with   Drug Overdose    Robin Cochran is a 36 y.o. female who presents for suspected drug overdose.  EMS and the patient give the history.  EMS reports that they were called out to the patient's home where she was found unresponsive and apneic with pinpoint pupils.  She was bagged and given Narcan  x 2 with immediate improvement in her symptoms.  She began coughing coughing and she was noted to remain hypoxic despite maintaining her airway and being alert.  The patient states that she smokes marijuana daily and just bought a bag from the guy at her local corner store which is where she normally goes.  She does not use opiates.  She denies any history of heroin abuse, or abuse of prescribed opiate medications.  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  She does not remember what happened after that until she was awoken.  The patient denies any daily medications.  She is never had anything like this before. Patient reports that she has had a cough for the past 3 days.   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   Asymptomatic bacteriuria in pregnancy 09/22/2012   Unspecified vitamin D deficiency 09/22/2012   Previous cesarean section 09/18/2012   Supervision of other normal pregnancy 09/18/2012    Past Surgical History:  Procedure Laterality Date   CESAREAN SECTION      TONSILLECTOMY     TUBAL LIGATION Bilateral 04/19/2018   Procedure: POST PARTUM TUBAL LIGATION;  Surgeon: Federico Flake, MD;  Location: [facility];  Service: Gynecology;  Laterality: Bilateral;     OB History     Gravida  3   Para  3   Term  3   Preterm      AB      Living  3      SAB      IAB      Ectopic      Multiple  0   Live Births  3           Family History  Problem Relation Age of Onset   Arthritis Mother    Hypertension Father    Hypertension Paternal Grandmother     Social History   Tobacco Use   Smoking status: Former    Types: E-cigarettes    Quit date: 05/11/2011    Years since quitting: 9.6   Smokeless tobacco: Never   Tobacco comments:    pt states, "I vape sometimes."  Vaping Use   Vaping Use: Never used  Substance Use Topics   Alcohol use: Yes    Home Medications Prior to Admission medications   Medication Sig Start Date End Date Taking? Authorizing Provider  amLODipine (NORVASC) 10 MG tablet Take 1 tablet (10 mg total) by mouth daily. 05/17/18   Sharyon Cable, CNM  amoxicillin-clavulanate (AUGMENTIN)  875-125 MG tablet Take 1 tablet by mouth every 12 (twelve) hours. 02/17/20   Khatri, Hina, PA-C  ferrous sulfate 325 (65 FE) MG tablet Take 1 tablet (325 mg total) by mouth 2 (two) times daily with a meal. 01/25/18   Brock Bad, MD  ibuprofen (ADVIL) 600 MG tablet Take 1 tablet (600 mg total) by mouth every 6 (six) hours as needed. 07/23/20   Elpidio Anis, PA-C  methocarbamol (ROBAXIN) 500 MG tablet Take 1 tablet (500 mg total) by mouth 2 (two) times daily. 07/23/20   Elpidio Anis, PA-C  omeprazole (PRILOSEC) 20 MG capsule Take 1 capsule (20 mg total) by mouth 2 (two) times daily before a meal. Patient not taking: Reported on 05/17/2018 01/24/18   Brock Bad, MD  oxyCODONE-acetaminophen (PERCOCET/ROXICET) 5-325 MG tablet Take 1 tablet by mouth every 8 (eight) hours as needed for severe pain. 02/17/20    Khatri, Hillary Bow, PA-C  Prenatal Vit-Fe Fumarate-FA (PRENATAL MULTIVITAMIN) TABS tablet Take 1 tablet by mouth daily at 12 noon.    [provider]  sertraline (ZOLOFT) 25 MG tablet Take 1 tablet (25 mg total) by mouth daily. 05/17/18   Sharyon Cable, CNM    Allergies    Patient has no allergy information on record.  Review of Systems   Review of Systems Ten systems reviewed and are negative for acute change, except as noted in the HPI.   Physical Exam Updated Vital Signs BP (!) 145/89   Pulse 76   Temp 97.8 F (36.6 C) (Oral)   Resp (!) 25   LMP 12/04/2020 (Approximate)   SpO2 92%   Physical Exam Vitals and nursing note reviewed.  Constitutional:      General: She is not in acute distress.    Appearance: She is well-developed. She is not diaphoretic.     Comments: Freq[place]unt hacking cough  HENT:     Head: Normocephalic and atraumatic.     Right Ear: External ear normal.     Left Ear: External ear normal.     Nose: Nose normal.     Mouth/Throat:     Mouth: Mucous membranes are moist.  Eyes:     General: No scleral icterus.    Extraocular Movements: Extraocular movements intact.     Conjunctiva/sclera: Conjunctivae normal.     Pupils: Pupils are equal, round, and reactive to light.  Cardiovascular:     Rate and Rhythm: Normal rate and regular rhythm.     Heart sounds: Normal heart sounds. No murmur heard.   No friction rub. No gallop.  Pulmonary:     Effort: Pulmonary effort is normal. No respiratory distress.     Breath sounds: Examination of the right-middle field reveals rales. Examination of the right-lower field reveals rales. Examination of the left-lower field reveals rales. Rales present.  Abdominal:     General: Bowel sounds are normal. There is no distension.     Palpations: Abdomen is soft. There is no mass.     Tenderness: There is no abdominal tenderness. There is no guarding.  Musculoskeletal:     Cervical back: Normal range of motion.  Skin:     General: Skin is warm and dry.  Neurological:     Mental Status: She is alert and oriented to person, place, and time.  Psychiatric:        Behavior: Behavior normal.    ED Results / Procedures / Treatments   Labs (all labs ordered are listed, but only abnormal results are  displayed) Labs Reviewed  RESP PANEL BY RT-PCR (FLU A&B, COVID) ARPGX2 - Abnormal; Notable for the following components:      Result Value   SARS Coronavirus 2 by RT PCR POSITIVE (*)    All other components within normal limits  COMPREHENSIVE METABOLIC PANEL - Abnormal; Notable for the following components:   Glucose, Bld 103 (*)    All other components within normal limits  SALICYLATE LEVEL - Abnormal; Notable for the following components:   Salicylate Lvl <7.0 (*)    All other components within normal limits  ACETAMINOPHEN LEVEL - Abnormal; Notable for the following components:   Acetaminophen (Tylenol), Serum <10 (*)    All other components within normal limits  CBG MONITORING, ED - Abnormal; Notable for the following components:   Glucose-Capillary 102 (*)    All other components within normal limits  CULTURE, BLOOD (ROUTINE X 2)  CULTURE, BLOOD (ROUTINE X 2)  ETHANOL  CBC WITH DIFFERENTIAL/PLATELET  RAPID URINE DRUG SCREEN, HOSP PERFORMED  LACTIC ACID, PLASMA  LACTIC ACID, PLASMA  D-DIMER, QUANTITATIVE  PROCALCITONIN  LACTATE DEHYDROGENASE  FERRITIN  TRIGLYCERIDES  FIBRINOGEN  C-REACTIVE PROTEIN  BLOOD GAS, ARTERIAL  I-STAT BETA HCG BLOOD, ED ([facility], [facility], [facility] ONLY)  POC SARS CORONAVIRUS 2 AG -  ED    EKG EKG Interpretation  Date/Time:  Wednesday December 31 2020 17:13:51 EDT Ventricular Rate:  94 PR Interval:  172 QRS Duration: 91 QT Interval:  356 QTC Calculation: 446 R Axis:   61 Text Interpretation: Sinus rhythm Consider left atrial enlargement Anteroseptal infarct, age indeterminate No significant change since last tracing Confirmed by Richardean CanalYao, David H ([id number]) on 12/31/2020 7:54:28  PM  Radiology DG Chest Port 1 View  Result Date: 12/31/2020 CLINICAL DATA:  Shortness of breath.  Found unresponsive by family. EXAM: PORTABLE CHEST 1 VIEW COMPARISON:  07/22/2020 FINDINGS: Heart size and mediastinal contours are normal. No signs of pleural effusion. There are diffuse airspace opacities identified throughout the left lung and right upper lobe. Visualized osseous structures are unremarkable. IMPRESSION: Bilateral airspace opacities compatible with multifocal infection. Electronically Signed   By: Signa Kellaylor  Stroud M.D.   On: 12/31/2020 17:39    Procedures .Critical Care  Date/Time: 12/31/2020 9:10 PM Performed by: Arthor CaptainHarris, Greig Altergott, PA-C Authorized by: Arthor CaptainHarris, Aking Klabunde, PA-C   Critical care provider statement:    Critical care time (minutes):  60   Critical care time was exclusive of:  Separately billable procedures and treating other patients   Critical care was necessary to treat or prevent imminent or life-threatening deterioration of the following conditions:  Respiratory failure   Critical care was time spent personally by me on the following activities:  Discussions with consultants, evaluation of patient's response to treatment, examination of patient, ordering and performing treatments and interventions, ordering and review of laboratory studies, ordering and review of radiographic studies, pulse oximetry, re-evaluation of patient's condition, obtaining history from patient or surrogate and review of old charts   Medications Ordered in ED Medications  dexamethasone (DECADRON) injection 10 mg (has no administration in time range)  iohexol (OMNIPAQUE) 350 MG/ML injection 65 mL (65 mLs Intravenous Contrast Given 12/31/20 2116)    ED Course  I have reviewed the triage vital signs and the nursing notes.  Pertinent labs & imaging results that were available during my care of the patient were reviewed by me and considered in my medical decision making (see chart for  details).    MDM Rules/Calculators/A&P  Patient presents the emergency department after suspected drug overdose found hypoxic, unconscious and apneic by EMS and improved after administration of BVM and Narcan.  Patient denies history of opiate abuse but does state that she has had a cough for the past several days and smokes marijuana daily.  Upon further assessment patient is positive for coronavirus on respiratory panel.  I have ordered other labs include a CBC which shows no elevated white blood cell count, CMP with mildly elevated blood glucose of insignificant value, negative Tylenol and salicylate level, negative alcohol level, negative pregnancy test.  I also ordered and reviewed a 1 view chest x-ray which shows extensive bilateral pulmonary infiltrate likely due to atypical viral infection and consistent with COVID-19 pneumonia.  The patient is in hypoxic respiratory failure on 6 L via nasal cannula with oxygen saturation about 89% on room air.  On reassessment of the patient she is also now having hemoptysis and I have high concern for potential pulmonary embolus as the underlying cause of her hypoxic respiratory failure.  Secondarily I wonder if the patient may have simply lost consciousness due to her hypoxic state and or secondary to an embolus.  She has a CT angiogram pending.  Patient has been given remdesivir, Decadron and Zosyn for coverage of her hemoptysis.  EKG shows sinus rhythm at a rate of 94.  Patient is tachypneic into the mid 20s.  She is critically ill requiring admission.  Case discussed with Dr. Toniann Fail who admit the patient to the hospitalist service. Final Clinical Impression(s) / ED Diagnoses Final diagnoses:  Acute respiratory failure with hypoxia (HCC)  Pneumonia due to COVID-19 virus    Rx / DC Orders ED Discharge Orders     None        Arthor Captain, PA-C 12/31/20 2221    Charlynne Pander, MD 12/31/20 2308

## 2020-12-31 NOTE — ED Notes (Signed)
Attempted to give reportx1 

## 2020-12-31 NOTE — ED Triage Notes (Signed)
Pt BIB [facility] from home. Pt found unresponsive by family. On EMS arrival, pt unresponsive, apneic, pinpoint pupils, SaO2 64%. BVM initiated, nasal trumpet placed, 2 narcan given. Pt aroused and began to have spontaneous respirations. Pt A&O X 4 on arrival to ED, but does not recall events of today. Endorses marijuana use. Reports cough X 3 days.

## 2020-12-31 NOTE — Progress Notes (Signed)
Pharmacy Antibiotic Note  Robin Cochran is a 35 y.o. female that presenting following suspected drug overdose. EMS reports finding patient unresponsive and apneic with pinpoint pupils, for which narcan was administered. Following naloxone and bagging patient became alert but remained hypoxic. Patient noted to have hemoptysis.  Pharmacy has been consulted for zosyn dosing.  COVID is positive, for which remdesivir has been initiated. SCr 0.96. WBC 9.  CT without findings of PE but with findings concerning for pna. CXR w/ Bilateral airspace opacities compatible with multifocal infection  Plan: Zosyn 3.375g IV q8h (4 hour infusion). Monitor renal function F/u cultures De-escalate when appropriate     Temp (24hrs), Avg:97.8 F (36.6 C), Min:97.8 F (36.6 C), Max:97.8 F (36.6 C)  Recent Labs  Lab 12/31/20 1701  WBC 9.0  CREATININE 0.96    CrCl cannot be calculated (Unknown ideal weight.).    Not on File  Antimicrobials this admission: Remdesivir x 5 days Zosyn 8/24 >>   Microbiology results: Pending  Thank you for allowing pharmacy to be a part of this patient's care.  Cathie Hoops 12/31/2020 8:57 PM

## 2021-01-01 ENCOUNTER — Inpatient Hospital Stay (HOSPITAL_COMMUNITY): Payer: Medicaid Other

## 2021-01-01 DIAGNOSIS — R7989 Other specified abnormal findings of blood chemistry: Secondary | ICD-10-CM

## 2021-01-01 DIAGNOSIS — U071 COVID-19: Secondary | ICD-10-CM

## 2021-01-01 LAB — COMPREHENSIVE METABOLIC PANEL
ALT: 16 U/L (ref 0–44)
AST: 26 U/L (ref 15–41)
Albumin: 3.6 g/dL (ref 3.5–5.0)
Alkaline Phosphatase: 48 U/L (ref 38–126)
Anion gap: 8 (ref 5–15)
BUN: 11 mg/dL (ref 6–20)
CO2: 25 mmol/L (ref 22–32)
Calcium: 8.9 mg/dL (ref 8.9–10.3)
Chloride: 101 mmol/L (ref 98–111)
Creatinine, Ser: 0.78 mg/dL (ref 0.44–1.00)
GFR, Estimated: >60 mL/min (ref >60)
Glucose, Bld: 111 mg/dL — ABNORMAL HIGH (ref 70–99)
Potassium: 3.6 mmol/L (ref 3.5–5.1)
Sodium: 134 mmol/L — ABNORMAL LOW (ref 135–145)
Total Bilirubin: 1.1 mg/dL (ref 0.3–1.2)
Total Protein: 6.5 g/dL (ref 6.5–8.1)

## 2021-01-01 LAB — CBC WITH DIFFERENTIAL/PLATELET
Abs Immature Granulocytes: 0.12 10*3/uL — ABNORMAL HIGH (ref 0.00–0.07)
Basophils Absolute: 0 10*3/uL (ref 0.0–0.1)
Basophils Relative: 0 %
Eosinophils Absolute: 0 10*3/uL (ref 0.0–0.5)
Eosinophils Relative: 0 %
HCT: 39.9 % (ref 36.0–46.0)
Hemoglobin: 13.4 g/dL (ref 12.0–15.0)
Immature Granulocytes: 1 %
Lymphocytes Relative: 2 %
Lymphs Abs: 0.5 10*3/uL — ABNORMAL LOW (ref 0.7–4.0)
MCH: 30.1 pg (ref 26.0–34.0)
MCHC: 33.6 g/dL (ref 30.0–36.0)
MCV: 89.7 fL (ref 80.0–100.0)
Monocytes Absolute: 0.6 10*3/uL (ref 0.1–1.0)
Monocytes Relative: 3 %
Neutro Abs: 18.7 10*3/uL — ABNORMAL HIGH (ref 1.7–7.7)
Neutrophils Relative %: 94 %
Platelets: 265 10*3/uL (ref 150–400)
RBC: 4.45 MIL/uL (ref 3.87–5.11)
RDW: 13.8 % (ref 11.5–15.5)
WBC: 19.8 10*3/uL — ABNORMAL HIGH (ref 4.0–10.5)
nRBC: 0 % (ref 0.0–0.2)

## 2021-01-01 LAB — RAPID URINE DRUG SCREEN, HOSP PERFORMED
Amphetamines: NOT DETECTED
Barbiturates: NOT DETECTED
Benzodiazepines: NOT DETECTED
Cocaine: NOT DETECTED
Opiates: NOT DETECTED
Tetrahydrocannabinol: POSITIVE — AB

## 2021-01-01 LAB — PROCALCITONIN: Procalcitonin: 1.34 ng/mL

## 2021-01-01 LAB — C-REACTIVE PROTEIN: CRP: 1.4 mg/dL — ABNORMAL HIGH (ref <1.0)

## 2021-01-01 LAB — D-DIMER, QUANTITATIVE: D-Dimer, Quant: 3.08 ug/mL-FEU — ABNORMAL HIGH (ref 0.00–0.50)

## 2021-01-01 LAB — HIV ANTIBODY (ROUTINE TESTING W REFLEX): HIV Screen 4th Generation wRfx: NONREACTIVE

## 2021-01-01 LAB — STREP PNEUMONIAE URINARY ANTIGEN: Strep Pneumo Urinary Antigen: NEGATIVE

## 2021-01-01 MED ORDER — AMOXICILLIN-POT CLAVULANATE 875-125 MG PO TABS
1.0000 | ORAL_TABLET | Freq: Two times a day (BID) | ORAL | Status: DC
  Administered 2021-01-01 (×2): 1 via ORAL
  Filled 2021-01-01 (×2): qty 1

## 2021-01-01 MED ORDER — ENOXAPARIN SODIUM 40 MG/0.4ML IJ SOSY
40.0000 mg | PREFILLED_SYRINGE | Freq: Every day | INTRAMUSCULAR | Status: DC
  Administered 2021-01-01 – 2021-01-02 (×2): 40 mg via SUBCUTANEOUS
  Filled 2021-01-01 (×2): qty 0.4

## 2021-01-01 MED ORDER — SODIUM CHLORIDE 0.9 % IV SOLN: 3.0000 g | Freq: Four times a day (QID) | INTRAVENOUS | Status: DC

## 2021-01-01 MED ORDER — METHYLPREDNISOLONE SODIUM SUCC 40 MG IJ SOLR
40.0000 mg | Freq: Two times a day (BID) | INTRAMUSCULAR | Status: DC
  Administered 2021-01-01 – 2021-01-02 (×3): 40 mg via INTRAVENOUS
  Filled 2021-01-01 (×3): qty 1

## 2021-01-01 NOTE — Progress Notes (Signed)
Bilateral lower extremity venous duplex has been completed. Preliminary results can be found in CV Proc through chart review.   01/01/21 12:52 PM Olen Cordial RVT

## 2021-01-01 NOTE — Progress Notes (Signed)
Cell phone and charger given to pt.

## 2021-01-01 NOTE — Progress Notes (Signed)
PROGRESS NOTE    Robin StarchKimberly Whittlesey  ONG:[id number]:[id number] DOB: 02-12-1986 DOA: 12/31/2020 PCP: Patient, No Pcp Per (Inactive)   Chief Complaint  Patient presents with   Drug Overdose   Brief Narrative:  Robin Cochran is Robin Cochran 35 y.o. female with no significant past medical history was brought to the ER after patient's son found patient was unresponsive.  As per the report patient's pupils were found to be pinpoint was given Narcan with which patient became more alert awake.  Patient stated that she does take marijuana but does not use any other drugs or take any other medications.  She did state that over the last couple of days she has been getting more cough with shortness of breath.  In the ER she did cough up some blood.  Patient states that is new.  Denies any chest pain.  Has recently traveled to [place] but did not notice any leg swelling.  No sick contacts at home.   ED Course: In the ER patient was hypoxic requiring 5 L oxygen.  COVID test was positive.  CBC and metabolic panel unremarkable.  EKG shows sinus rhythm.  CT angiogram of the chest was done because of elevated D-dimer which shows multifocal pneumonia negative for pulm embolism.  Patient is started on empiric antibiotics for COVID 19 infection and also for community-acquired pneumonia.  Admitted for acute respiratory failure with hypoxia.  Assessment & Plan:   Principal Problem:   Acute respiratory failure due to COVID-19 [facility](HCC) Active Problems:   Multifocal pneumonia  Acute Hypoxic Respiratory Failure 2/2 COVID 19 Pneumonia Currently requiring 4-5 L, weaned to 2 L at bedside (satting in 90's) Continue steroids, remdesivir Discussed actemra, she's ok if we need to use this - discussed off label use, risks, benefits CT PE protocol with bilateral airspace disease most compatible with multifocal pneumonia Procal 0.12, but given hx of LOC/unresponsiveness and elevated white count, will continue abx to cover any possible aspiration  pneumonia - suspect covid is main driver Strict I/O, daily weights IS, flutter, OOB   COVID-19 Labs  Recent Labs    12/31/20 2021 01/01/21 0034  DDIMER 3.46* 3.08*  FERRITIN 44  --   LDH 181  --   CRP <0.5 1.4*    Lab Results  Component Value Date   SARSCOV2NAA POSITIVE (Sabriah Hobbins) 12/31/2020   Elevated D dimer CT PE protocol negative for PE LE US pending Likely 2/2 covid above  Small Volume Hemoptysis  Likely 2/2 pneumonia - follow - she notes improving  Cautious resumption of dvt PPX  Syncope could be from hypoxia vs substance use with response with narcan.  Closely monitor in telemetry check 2D echo.   Utox positive for MJ, ? Synthetic opiates (she denies any known drug use other than MJ)  DVT prophylaxis: lovenox Code Status: full  Family Communication: none at bedside Disposition:   Status is: Inpatient  Remains inpatient appropriate because:Inpatient level of care appropriate due to severity of illness  Dispo: The patient is from: Home              Anticipated d/c is to: Home              Patient currently is not medically stable to d/c.   Difficult to place patient No       Consultants:  none  Procedures: none  Antimicrobials:  Anti-infectives (From admission, onward)    Start     Dose/Rate Route Frequency Ordered Stop   01/01/21 1200  Ampicillin-Sulbactam (UNASYN)  3 g in sodium chloride 0.9 % 100 mL IVPB  Status:  Discontinued        3 g 200 mL/hr over 30 Minutes Intravenous Every 6 hours 01/01/21 0805 01/01/21 1040   01/01/21 1000  remdesivir 100 mg in sodium chloride 0.9 % 100 mL IVPB       See Hyperspace for full Linked Orders Report.   100 mg 200 mL/hr over 30 Minutes Intravenous Daily 12/31/20 2126 01/05/21 0959   01/01/21 0530  piperacillin-tazobactam (ZOSYN) IVPB 3.375 g  Status:  Discontinued       See Hyperspace for full Linked Orders Report.   3.375 g 12.5 mL/hr over 240 Minutes Intravenous Every 8 hours 12/31/20 2126 01/01/21 0804    12/31/20 2200  azithromycin (ZITHROMAX) 500 mg in sodium chloride 0.9 % 250 mL IVPB  Status:  Discontinued        500 mg 250 mL/hr over 60 Minutes Intravenous Every 24 hours 12/31/20 2157 01/01/21 1040   12/31/20 2125  piperacillin-tazobactam (ZOSYN) IVPB 3.375 g       See Hyperspace for full Linked Orders Report.   3.375 g 100 mL/hr over 30 Minutes Intravenous  Once 12/31/20 2126 12/31/20 2304   12/31/20 2125  remdesivir 200 mg in sodium chloride 0.9% 250 mL IVPB       See Hyperspace for full Linked Orders Report.   200 mg 580 mL/hr over 30 Minutes Intravenous Once 12/31/20 2126 01/01/21 0310          Subjective: Feels better than at presentation  Objective: Vitals:   01/01/21 0039 01/01/21 0345 01/01/21 0731 01/01/21 0944  BP: 119/65 120/81 (!) 139/96   Pulse: 77 72 90   Resp: (!) 24 (!) 22 (!) 22   Temp: 99.5 F (37.5 C) 99.4 F (37.4 C) 99.6 F (37.6 C)   TempSrc: Oral Oral Axillary   SpO2: 93% 95% 99% 97%  Weight:      Height:        Intake/Output Summary (Last 24 hours) at 01/01/2021 1209 Last data filed at 01/01/2021 1105 Gross per 24 hour  Intake 240 ml  Output 300 ml  Net -60 ml   Filed Weights   01/01/21 0035  Weight: 79.5 kg    Examination:  General exam: Appears calm and comfortable  Respiratory system: Clear to auscultation. Respiratory effort normal. Cardiovascular system: S1 & S2 heard, RRR.  Gastrointestinal system: Abdomen is nondistended, soft and nontender. Central nervous system: Alert and oriented. No focal neurological deficits. Extremities: no LEE Skin: No rashes, lesions or ulcers Psychiatry: Judgement and insight appear normal. Mood & affect appropriate.     Data Reviewed: I have personally reviewed following labs and imaging studies  CBC: Recent Labs  Lab 12/31/20 1701 12/31/20 2126 01/01/21 0034  WBC 9.0  --  19.8*  NEUTROABS 6.5  --  18.7*  HGB 13.7 13.6 13.4  HCT 42.4 40.0 39.9  MCV 93.0  --  89.7  PLT 292  --  265     Basic Metabolic Panel: Recent Labs  Lab 12/31/20 1701 12/31/20 2126 01/01/21 0034  NA 138 136 134*  K 3.8 3.5 3.6  CL 101  --  101  CO2 24  --  25  GLUCOSE 103*  --  111*  BUN 10  --  11  CREATININE 0.96  --  0.78  CALCIUM 9.0  --  8.9    GFR: Estimated Creatinine Clearance: 105.4 mL/min (by C-G formula based on SCr of 0.78  mg/dL).  Liver Function Tests: Recent Labs  Lab 12/31/20 1701 01/01/21 0034  AST 33 26  ALT 13 16  ALKPHOS 55 48  BILITOT 0.7 1.1  PROT 6.6 6.5  ALBUMIN 3.8 3.6    CBG: Recent Labs  Lab 12/31/20 1712  GLUCAP 102*     Recent Results (from the past 240 hour(s))  Resp Panel by RT-PCR (Flu Merlin Golden&B, Covid) Nasopharyngeal Swab     Status: Abnormal   Collection Time: 12/31/20  6:02 PM   Specimen: Nasopharyngeal Swab; Nasopharyngeal(NP) swabs in vial transport medium  Result Value Ref Range Status   SARS Coronavirus 2 by RT PCR POSITIVE (Honest Vanleer) NEGATIVE Final    Comment: RESULT CALLED TO, READ BACK BY AND VERIFIED WITHHenrene Pastor RN [id number] 12/31/20 Delmus Warwick BROWNING (NOTE) SARS-CoV-2 target nucleic acids are DETECTED.  The SARS-CoV-2 RNA is generally detectable in upper respiratory specimens during the acute phase of infection. Positive results are indicative of the presence of the identified virus, but do not rule out bacterial infection or co-infection with other pathogens not detected by the test. Clinical correlation with patient history and other diagnostic information is necessary to determine patient infection status. The expected result is Negative.  Fact Sheet for Patients: BloggerCourse.com  Fact Sheet for Healthcare Providers: SeriousBroker.it  This test is not yet approved or cleared by the [place] FDA and  has been authorized for detection and/or diagnosis of SARS-CoV-2 by FDA under an Emergency Use Authorization (EUA).  This EUA will remain in effect (meaning this test  can be  used) for the duration of  the COVID-19 declaration under Section 564(b)(1) of the Act, 21 U.S.C. section 360bbb-3(b)(1), unless the authorization is terminated or revoked sooner.     Influenza Akina Maish by PCR NEGATIVE NEGATIVE Final   Influenza B by PCR NEGATIVE NEGATIVE Final    Comment: (NOTE) The Xpert Xpress SARS-CoV-2/FLU/RSV plus assay is intended as an aid in the diagnosis of influenza from Nasopharyngeal swab specimens and should not be used as Treyton Slimp sole basis for treatment. Nasal washings and aspirates are unacceptable for Xpert Xpress SARS-CoV-2/FLU/RSV testing.  Fact Sheet for Patients: BloggerCourse.com  Fact Sheet for Healthcare Providers: SeriousBroker.it  This test is not yet approved or cleared by the [place] FDA and has been authorized for detection and/or diagnosis of SARS-CoV-2 by FDA under an Emergency Use Authorization (EUA). This EUA will remain in effect (meaning this test can be used) for the duration of the COVID-19 declaration under Section 564(b)(1) of the Act, 21 U.S.C. section 360bbb-3(b)(1), unless the authorization is terminated or revoked.  Performed at [facility], [address], [place], [place] [address]   Blood Culture (routine x 2)     Status: None (Preliminary result)   Collection Time: 12/31/20  8:21 PM   Specimen: BLOOD LEFT HAND  Result Value Ref Range Status   Specimen Description BLOOD LEFT HAND  Final   Special Requests   Final    BOTTLES DRAWN AEROBIC AND ANAEROBIC Blood Culture results may not be optimal due to an inadequate volume of blood received in culture bottles   Culture   Final    NO GROWTH < 12 HOURS Performed at [facility], [address], [place], [place] [address]    Report Status PENDING  Incomplete  Blood Culture (routine x 2)     Status: None (Preliminary result)   Collection Time: 12/31/20  8:41 PM   Specimen: BLOOD RIGHT HAND  Result Value Ref  Range Status   Specimen Description BLOOD RIGHT HAND  Final   Special Requests   Final    BOTTLES DRAWN AEROBIC AND ANAEROBIC Blood Culture results may not be optimal due to an inadequate volume of blood received in culture bottles   Culture   Final    NO GROWTH < 12 HOURS Performed at [facility], [address], [place], [place] [address]    Report Status PENDING  Incomplete         Radiology Studies: CT Angio Chest PE W and/or Wo Contrast  Result Date: 12/31/2020 CLINICAL DATA:  PE suspected, high prob EXAM: CT ANGIOGRAPHY CHEST WITH CONTRAST TECHNIQUE: Multidetector CT imaging of the chest was performed using the standard protocol during bolus administration of intravenous contrast. Multiplanar CT image reconstructions and MIPs were obtained to evaluate the vascular anatomy. CONTRAST:  37mL OMNIPAQUE IOHEXOL 350 MG/ML SOLN COMPARISON:  None. FINDINGS: Cardiovascular: Suboptimal opacification of the pulmonary arteries. No large or central pulmonary emboli noted. Heart is normal size. Aorta is normal caliber. Mediastinum/Nodes: No mediastinal, hilar, or axillary adenopathy. Trachea and esophagus are unremarkable. Thyroid unremarkable. Lungs/Pleura: Extensive bilateral airspace disease, left greater than right. No effusions. Upper Abdomen: Imaging into the upper abdomen demonstrates no acute findings. Musculoskeletal: Chest wall soft tissues are unremarkable. No acute bony abnormality. Review of the MIP images confirms the above findings. IMPRESSION: No large or central pulmonary emboli. Suboptimal opacification of the peripheral vessels. Extensive bilateral airspace disease most compatible with multifocal pneumonia. Electronically Signed   By: Charlett Nose M.D.   On: 12/31/2020 21:22   DG Chest Port 1 View  Result Date: 12/31/2020 CLINICAL DATA:  Shortness of breath.  Found unresponsive by family. EXAM: PORTABLE CHEST 1 VIEW COMPARISON:  07/22/2020 FINDINGS: Heart size and mediastinal  contours are normal. No signs of pleural effusion. There are diffuse airspace opacities identified throughout the left lung and right upper lobe. Visualized osseous structures are unremarkable. IMPRESSION: Bilateral airspace opacities compatible with multifocal infection. Electronically Signed   By: Signa Kell M.D.   On: 12/31/2020 17:39        Scheduled Meds:  enoxaparin (LOVENOX) injection  40 mg Subcutaneous Daily   methylPREDNISolone (SOLU-MEDROL) injection  40 mg Intravenous Q12H   Continuous Infusions:  remdesivir 100 mg in NS 100 mL 100 mg (01/01/21 0915)     LOS: 1 day    Time spent: over 30 min    Lacretia Nicks, MD [facility]   To contact the attending provider between 7A-7P or the covering provider during after hours 7P-7A, please log into the web site [email or web address] and access using universal [place] password for that web site. If you do not have the password, please call the hospital operator.  01/01/2021, 12:09 PM

## 2021-01-01 NOTE — Progress Notes (Signed)
Urine sample collected and sent down to lab

## 2021-01-01 NOTE — Plan of Care (Signed)
Pt admitted to 5W02 from ED accompanied by significant other. Report received by Florentina Addison, RN. VSS, tele in place, 6L , PIVs SL, no c/o pain; skin assessment completed with Delorise Jackson, RN. Pt oriented to room and call bell. Safety measures initiated. Isolation precautions initiated. Bed is low and brakes are locked. Call bell within reach. Bed alarm activated. Will continue to monitor.

## 2021-01-02 ENCOUNTER — Inpatient Hospital Stay (HOSPITAL_COMMUNITY): Payer: Medicaid Other

## 2021-01-02 DIAGNOSIS — R55 Syncope and collapse: Secondary | ICD-10-CM

## 2021-01-02 LAB — CBC WITH DIFFERENTIAL/PLATELET
Abs Immature Granulocytes: 0.16 10*3/uL — ABNORMAL HIGH (ref 0.00–0.07)
Basophils Absolute: 0 10*3/uL (ref 0.0–0.1)
Basophils Relative: 0 %
Eosinophils Absolute: 0 10*3/uL (ref 0.0–0.5)
Eosinophils Relative: 0 %
HCT: 35.5 % — ABNORMAL LOW (ref 36.0–46.0)
Hemoglobin: 12.3 g/dL (ref 12.0–15.0)
Immature Granulocytes: 1 %
Lymphocytes Relative: 5 %
Lymphs Abs: 1.2 10*3/uL (ref 0.7–4.0)
MCH: 30.5 pg (ref 26.0–34.0)
MCHC: 34.6 g/dL (ref 30.0–36.0)
MCV: 88.1 fL (ref 80.0–100.0)
Monocytes Absolute: 0.5 10*3/uL (ref 0.1–1.0)
Monocytes Relative: 2 %
Neutro Abs: 20.5 10*3/uL — ABNORMAL HIGH (ref 1.7–7.7)
Neutrophils Relative %: 92 %
Platelets: 262 10*3/uL (ref 150–400)
RBC: 4.03 MIL/uL (ref 3.87–5.11)
RDW: 13.7 % (ref 11.5–15.5)
WBC: 22.5 10*3/uL — ABNORMAL HIGH (ref 4.0–10.5)
nRBC: 0 % (ref 0.0–0.2)

## 2021-01-02 LAB — ECHOCARDIOGRAM COMPLETE
AR max vel: 3.14 cm2
AV Area VTI: 3.14 cm2
AV Area mean vel: 3.05 cm2
AV Mean grad: 4 mmHg
AV Peak grad: 6.8 mmHg
Ao pk vel: 1.3 m/s
Area-P 1/2: 2.87 cm2
Calc EF: 51.2 %
Height: 66 in
MV VTI: 3.02 cm2
S' Lateral: 3.2 cm
Single Plane A2C EF: 55.5 %
Single Plane A4C EF: 49.2 %
Weight: 2804.25 oz

## 2021-01-02 LAB — COMPREHENSIVE METABOLIC PANEL
ALT: 15 U/L (ref 0–44)
AST: 21 U/L (ref 15–41)
Albumin: 3.5 g/dL (ref 3.5–5.0)
Alkaline Phosphatase: 63 U/L (ref 38–126)
Anion gap: 8 (ref 5–15)
BUN: 11 mg/dL (ref 6–20)
CO2: 26 mmol/L (ref 22–32)
Calcium: 9.4 mg/dL (ref 8.9–10.3)
Chloride: 102 mmol/L (ref 98–111)
Creatinine, Ser: 0.78 mg/dL (ref 0.44–1.00)
GFR, Estimated: >60 mL/min (ref >60)
Glucose, Bld: 110 mg/dL — ABNORMAL HIGH (ref 70–99)
Potassium: 4.1 mmol/L (ref 3.5–5.1)
Sodium: 136 mmol/L (ref 135–145)
Total Bilirubin: 0.8 mg/dL (ref 0.3–1.2)
Total Protein: 6.3 g/dL — ABNORMAL LOW (ref 6.5–8.1)

## 2021-01-02 LAB — LEGIONELLA PNEUMOPHILA SEROGP 1 UR AG: L. pneumophila Serogp 1 Ur Ag: NEGATIVE

## 2021-01-02 LAB — C-REACTIVE PROTEIN: CRP: 12.3 mg/dL — ABNORMAL HIGH (ref <1.0)

## 2021-01-02 LAB — D-DIMER, QUANTITATIVE: D-Dimer, Quant: 0.74 ug/mL-FEU — ABNORMAL HIGH (ref 0.00–0.50)

## 2021-01-02 LAB — PROCALCITONIN: Procalcitonin: 1.45 ng/mL

## 2021-01-02 MED ORDER — TRAZODONE HCL 50 MG PO TABS: 50.0000 mg | ORAL_TABLET | Freq: Every evening | ORAL | 0 refills | Status: AC | PRN

## 2021-01-02 MED ORDER — SODIUM CHLORIDE 0.9 % IV SOLN
3.0000 g | Freq: Four times a day (QID) | INTRAVENOUS | Status: DC
  Administered 2021-01-02: 3 g via INTRAVENOUS
  Filled 2021-01-02: qty 8

## 2021-01-02 MED ORDER — AMOXICILLIN-POT CLAVULANATE 875-125 MG PO TABS: 1.0000 | ORAL_TABLET | Freq: Two times a day (BID) | ORAL | 0 refills | Status: AC

## 2021-01-02 MED ORDER — PREDNISONE 10 MG PO TABS: ORAL_TABLET | ORAL | 0 refills | Status: AC

## 2021-01-02 NOTE — Progress Notes (Addendum)
Attempted to provide discharge education and packet provided to patient at bedside, patient was not in room, patient not found on unit, secretary reported that another staff member witnessed patient leaving unit. IV not noted in room. Primary nurse and charge nurse notified.

## 2021-01-02 NOTE — Discharge Summary (Signed)
Physician Discharge Summary  Robin Cochran CHE:[id number] DOB: 1986-05-10 DOA: 12/31/2020  PCP: Patient, No Pcp Per (Inactive)  Admit date: 12/31/2020 Discharge date: 01/02/2021  Time spent: 40 minutes  Recommendations for Outpatient Follow-up:  Follow outpatient CBC/CMP Follow repeat chest imaging outpatient    Discharge Diagnoses:  Principal Problem:   Acute respiratory failure due to COVID-19 [facility]) Active Problems:   Multifocal pneumonia   Discharge Condition: stable  Diet recommendation: heart healthy  Filed Weights   01/01/21 0035  Weight: 79.5 kg    History of present illness:  Robin Cochran is Robin Cochran 35 y.o. female with no significant past medical history was brought to the ER after patient's son found patient was unresponsive.  As per the report patient's pupils were found to be pinpoint was given Narcan with which patient became more alert awake.  Patient stated that she does take marijuana but does not use any other drugs or take any other medications.  She did state that over the last couple of days she has been getting more cough with shortness of breath.  In the ER she did cough up some blood.  Patient states that is new.  Denies any chest pain.  Has recently traveled to [place] but did not notice any leg swelling.  No sick contacts at home.   ED Course: In the ER patient was hypoxic requiring 5 L oxygen.  COVID test was positive.  CBC and metabolic panel unremarkable.  EKG shows sinus rhythm.  CT angiogram of the chest was done because of elevated D-dimer which shows multifocal pneumonia negative for pulm embolism.  Patient is started on empiric antibiotics for COVID 19 infection and also for community-acquired pneumonia.  Admitted for acute respiratory failure with hypoxia.  She was admitted for multifocal pneumonia in the setting of COVID in addition to episode of unresponsiveness which improved with narcan.  Suspect multifocal pneumonia 2/2 covid +/- aspiration with  LOC.  She's improved, on RA on 8/26.  Plan for d/c home.   Hospital Course:  Acute Hypoxic Respiratory Failure 2/2 COVID 19 Pneumonia Weaned to RA - at RA with ambulation Continue steroids (will send home with steroids taper), remdesivir x3 days completed CT PE protocol with bilateral airspace disease most compatible with multifocal pneumonia Elevated inflammatory markers, elevated procal today - she's symptomatically better, continue steroids/abx at discharge (? If this is related to suspected aspiration?) Strict I/O, daily weights IS, flutter, OOB Suspect Robin Cochran component of aspiration with LOC related to substance use (improved with narcan)  COVID-19 Labs  Recent Labs    12/31/20 2021 01/01/21 0034 01/02/21 0146  DDIMER 3.46* 3.08* 0.74*  FERRITIN 44  --   --   LDH 181  --   --   CRP <0.5 1.4* 12.3*    Lab Results  Component Value Date   SARSCOV2NAA POSITIVE (Robin Cochran) 12/31/2020   Elevated D dimer CT PE protocol negative for PE LE [place] negative for DVT Likely 2/2 covid above   Small Volume Hemoptysis  Likely 2/2 pneumonia - follow - she notes improving  Cautious resumption of dvt PPX   Syncope could be from hypoxia vs substance use with response with narcan.  Closely monitor in telemetry check 2D echo.   Utox positive for MJ, ? Synthetic opiates (she denies any known drug use other than MJ) Suspect this was from opiates with her improvement with narcan (she mentioned concern for fentanyl today - says she was told that by someone)  Procedures: Echo IMPRESSIONS  1. Left ventricular ejection fraction, by estimation, is 55 to 60%. The  left ventricle has normal function. The left ventricle has no regional  wall motion abnormalities. There is mild left ventricular hypertrophy.  Left ventricular diastolic parameters  were normal.   2. Right ventricular systolic function is normal. The right ventricular  size is normal. There is normal pulmonary artery systolic pressure. The   estimated right ventricular systolic pressure is 22.0 mmHg.   3. The mitral valve is normal in structure. Trivial mitral valve  regurgitation. No evidence of mitral stenosis.   4. The aortic valve is grossly normal. Aortic valve regurgitation is not  visualized. No aortic stenosis is present.   5. The inferior vena cava is normal in size with greater than 50%  respiratory variability, suggesting right atrial pressure of 3 mmHg.   Venous US Summary:  RIGHT:  - There is no evidence of deep vein thrombosis in the lower extremity.     - No cystic structure found in the popliteal fossa.     LEFT:  - There is no evidence of deep vein thrombosis in the lower extremity.     - No cystic structure found in the popliteal fossa.      *See table(s) above for measurements and observations.   Consultations: none  Discharge Exam: Vitals:   01/02/21 0807 01/02/21 1206  BP: (!) 135/95 (!) 135/99  Pulse: 92 96  Resp: 17 20  Temp: 98.1 F (36.7 C) 98.2 F (36.8 C)  SpO2: 100% 100%   Eager to discharge, says there's no one to help care for her children - she's been pulling off leads/stickers/IV's She's feeling much better  General: No acute distress. Cardiovascular: Heart sounds show Robin Cochran regular rate, and rhythm.  Lungs: Clear to auscultation bilaterally  Abdomen: Soft, nontender, nondistended  Neurological: Alert and oriented 3. Moves all extremities 4 . Cranial nerves II through XII grossly intact. Skin: Warm and dry. No rashes or lesions. Extremities: No clubbing or cyanosis. No edema.  Discharge Instructions   Discharge Instructions     Call MD for:  difficulty breathing, headache or visual disturbances   Complete by: As directed    Call MD for:  extreme fatigue   Complete by: As directed    Call MD for:  hives   Complete by: As directed    Call MD for:  persistant dizziness or light-headedness   Complete by: As directed    Call MD for:  persistant nausea and vomiting    Complete by: As directed    Call MD for:  redness, tenderness, or signs of infection (pain, swelling, redness, odor or green/yellow discharge around incision site)   Complete by: As directed    Call MD for:  severe uncontrolled pain   Complete by: As directed    Call MD for:  temperature >100.4   Complete by: As directed    Diet - low sodium heart healthy   Complete by: As directed    Discharge instructions   Complete by: As directed    You were seen for COVID pneumonia.  I think you also had an aspiration event related to loss of consciousness from fentanyl.    We're treating you with steroids and antibiotics.  You had 3 days of remdesivir in the hospital for your covid.  Continue to isolate.  10 days from your positive test (until Sept 3rd).    Avoid any illicit substances.    Please follow up with Aracelys Glade PCP  as an outpatient.  You need Flo Berroa PCP to discuss repeat imaging and follow up with.  Return for new, recurrent, or worsening symptoms.  Please ask your PCP to request records from this hospitalization so they know what was done and what the next steps will be.   Increase activity slowly   Complete by: As directed       Allergies as of 01/02/2021   No Known Allergies      Medication List     STOP taking these medications    amLODipine 10 MG tablet Commonly known as: Norvasc   ferrous sulfate 325 (65 FE) MG tablet   ibuprofen 600 MG tablet Commonly known as: ADVIL   methocarbamol 500 MG tablet Commonly known as: ROBAXIN   omeprazole 20 MG capsule Commonly known as: PriLOSEC   oxyCODONE-acetaminophen 5-325 MG tablet Commonly known as: PERCOCET/ROXICET   sertraline 25 MG tablet Commonly known as: Zoloft       TAKE these medications    amoxicillin-clavulanate 875-125 MG tablet Commonly known as: Augmentin Take 1 tablet by mouth 2 (two) times daily for 5 days. What changed: when to take this   predniSONE 10 MG tablet Commonly known as: DELTASONE Take 4  tablets (40 mg total) by mouth daily for 2 days, THEN 3 tablets (30 mg total) daily for 2 days, THEN 2 tablets (20 mg total) daily for 2 days, THEN 1 tablet (10 mg total) daily for 2 days. Start taking on: January 02, 2021   traZODone 50 MG tablet Commonly known as: DESYREL Take 1 tablet (50 mg total) by mouth at bedtime as needed for up to 10 doses for sleep.       No Known Allergies  Follow-up Information     Primary Care at [facility] Follow up on 03/04/2021.   Specialty: Family Medicine Why: 1:40 pm  please go to this appointment, if for some reason you need to reschedule, call and do so. Contact information: [address] [place] [address] [phone]                 The results of significant diagnostics from this hospitalization (including imaging, microbiology, ancillary and laboratory) are listed below for reference.    Significant Diagnostic Studies: CT Angio Chest PE W and/or Wo Contrast  Result Date: 12/31/2020 CLINICAL DATA:  PE suspected, high prob EXAM: CT ANGIOGRAPHY CHEST WITH CONTRAST TECHNIQUE: Multidetector CT imaging of the chest was performed using the standard protocol during bolus administration of intravenous contrast. Multiplanar CT image reconstructions and MIPs were obtained to evaluate the vascular anatomy. CONTRAST:  65mL OMNIPAQUE IOHEXOL 350 MG/ML SOLN COMPARISON:  None. FINDINGS: Cardiovascular: Suboptimal opacification of the pulmonary arteries. No large or central pulmonary emboli noted. Heart is normal size. Aorta is normal caliber. Mediastinum/Nodes: No mediastinal, hilar, or axillary adenopathy. Trachea and esophagus are unremarkable. Thyroid unremarkable. Lungs/Pleura: Extensive bilateral airspace disease, left greater than right. No effusions. Upper Abdomen: Imaging into the upper abdomen demonstrates no acute findings. Musculoskeletal: Chest wall soft tissues are unremarkable. No acute bony abnormality.  Review of the MIP images confirms the above findings. IMPRESSION: No large or central pulmonary emboli. Suboptimal opacification of the peripheral vessels. Extensive bilateral airspace disease most compatible with multifocal pneumonia. Electronically Signed   By: Charlett Nose M.D.   On: 12/31/2020 21:22   DG CHEST PORT 1 VIEW  Result Date: 01/02/2021 CLINICAL DATA:  Chest pain.  COVID-19 positive. EXAM: PORTABLE CHEST 1 VIEW COMPARISON:  December 31, 2020. FINDINGS: The heart size and mediastinal contours are within normal limits. Bilateral lung opacities are noted consistent with multifocal pneumonia. The visualized skeletal structures are unremarkable. IMPRESSION: Stable bilateral lung opacities are noted consistent with multifocal pneumonia. Electronically Signed   By: Lupita Raider M.D.   On: 01/02/2021 11:24   DG Chest Port 1 View  Result Date: 12/31/2020 CLINICAL DATA:  Shortness of breath.  Found unresponsive by family. EXAM: PORTABLE CHEST 1 VIEW COMPARISON:  07/22/2020 FINDINGS: Heart size and mediastinal contours are normal. No signs of pleural effusion. There are diffuse airspace opacities identified throughout the left lung and right upper lobe. Visualized osseous structures are unremarkable. IMPRESSION: Bilateral airspace opacities compatible with multifocal infection. Electronically Signed   By: Signa Kell M.D.   On: 12/31/2020 17:39   ECHOCARDIOGRAM COMPLETE  Result Date: 01/02/2021    ECHOCARDIOGRAM REPORT   Patient Name:   Robin Cochran Date of Exam: 01/02/2021 Medical Rec #:  [id number]        Height:       66.0 in Accession #:    [id number]       Weight:       175.3 lb Date of Birth:  [birth year]        BSA:          1.891 m Patient Age:    35 years         BP:           135/95 mmHg Patient Gender: F                HR:           84 bpm. Exam Location:  Inpatient Procedure: 2D Echo, Cardiac Doppler and Color Doppler Indications:    Syncope                 Covid  History:         Patient has no prior history of Echocardiogram examinations.  Sonographer:    Neomia Dear RDCS Referring Phys: [phone] Damonta Cossey CALDWELL POWELL JR IMPRESSIONS  1. Left ventricular ejection fraction, by estimation, is 55 to 60%. The left ventricle has normal function. The left ventricle has no regional wall motion abnormalities. There is mild left ventricular hypertrophy. Left ventricular diastolic parameters were normal.  2. Right ventricular systolic function is normal. The right ventricular size is normal. There is normal pulmonary artery systolic pressure. The estimated right ventricular systolic pressure is 22.0 mmHg.  3. The mitral valve is normal in structure. Trivial mitral valve regurgitation. No evidence of mitral stenosis.  4. The aortic valve is grossly normal. Aortic valve regurgitation is not visualized. No aortic stenosis is present.  5. The inferior vena cava is normal in size with greater than 50% respiratory variability, suggesting right atrial pressure of 3 mmHg. FINDINGS  Left Ventricle: Left ventricular ejection fraction, by estimation, is 55 to 60%. The left ventricle has normal function. The left ventricle has no regional wall motion abnormalities. The left ventricular internal cavity size was normal in size. There is  mild left ventricular hypertrophy. Left ventricular diastolic parameters were normal. Right Ventricle: The right ventricular size is normal. No increase in right ventricular wall thickness. Right ventricular systolic function is normal. There is normal pulmonary artery systolic pressure. The tricuspid regurgitant velocity is 2.18 m/s, and  with an assumed right atrial pressure of 3 mmHg, the estimated right ventricular systolic pressure is 22.0 mmHg. Left Atrium: Left atrial size was normal in  size. Right Atrium: Right atrial size was normal in size. Pericardium: There is no evidence of pericardial effusion. Mitral Valve: The mitral valve is normal in structure. Trivial mitral valve  regurgitation. No evidence of mitral valve stenosis. MV peak gradient, 3.6 mmHg. The mean mitral valve gradient is 2.0 mmHg. Tricuspid Valve: The tricuspid valve is normal in structure. Tricuspid valve regurgitation is trivial. No evidence of tricuspid stenosis. Aortic Valve: The aortic valve is grossly normal. Aortic valve regurgitation is not visualized. No aortic stenosis is present. Aortic valve mean gradient measures 4.0 mmHg. Aortic valve peak gradient measures 6.8 mmHg. Aortic valve area, by VTI measures 3.14 cm. Pulmonic Valve: The pulmonic valve was normal in structure. Pulmonic valve regurgitation is trivial. No evidence of pulmonic stenosis. Aorta: The aortic root is normal in size and structure. Venous: The inferior vena cava is normal in size with greater than 50% respiratory variability, suggesting right atrial pressure of 3 mmHg. IAS/Shunts: No atrial level shunt detected by color flow Doppler.  LEFT VENTRICLE PLAX 2D LVIDd:         4.30 cm      Diastology LVIDs:         3.20 cm      LV e' medial:    8.27 cm/s LV PW:         1.10 cm      LV E/e' medial:  8.6 LV IVS:        1.10 cm      LV e' lateral:   16.10 cm/s LVOT diam:     2.10 cm      LV E/e' lateral: 4.4 LV SV:         72 LV SV Index:   38 LVOT Area:     3.46 cm  LV Volumes (MOD) LV vol d, MOD A2C: 122.0 ml LV vol d, MOD A4C: 76.4 ml LV vol s, MOD A2C: 54.3 ml LV vol s, MOD A4C: 38.8 ml LV SV MOD A2C:     67.7 ml LV SV MOD A4C:     76.4 ml LV SV MOD BP:      51.5 ml RIGHT VENTRICLE RV Basal diam:  3.90 cm RV Mid diam:    2.60 cm LEFT ATRIUM             Index       RIGHT ATRIUM           Index LA diam:        2.80 cm 1.48 cm/m  RA Area:     11.80 cm LA Vol (A2C):   62.6 ml 33.11 ml/m RA Volume:   28.50 ml  15.07 ml/m LA Vol (A4C):   36.1 ml 19.09 ml/m LA Biplane Vol: 50.7 ml 26.82 ml/m  AORTIC VALVE                   PULMONIC VALVE AV Area (Vmax):    3.14 cm    PV Vmax:       1.25 m/s AV Area (Vmean):   3.05 cm    PV Vmean:      82.400  cm/s AV Area (VTI):     3.14 cm    PV VTI:        0.237 m AV Vmax:           130.00 cm/s PV Peak grad:  6.2 mmHg AV Vmean:          86.900 cm/s PV Mean grad:  3.0 mmHg  AV VTI:            0.228 m AV Peak Grad:      6.8 mmHg AV Mean Grad:      4.0 mmHg LVOT Vmax:         118.00 cm/s LVOT Vmean:        76.400 cm/s LVOT VTI:          0.207 m LVOT/AV VTI ratio: 0.91  AORTA Ao Root diam: 3.10 cm Ao Asc diam:  2.80 cm MITRAL VALVE               TRICUSPID VALVE MV Area (PHT): 2.87 cm    TR Peak grad:   19.0 mmHg MV Area VTI:   3.02 cm    TR Vmax:        218.00 cm/s MV Peak grad:  3.6 mmHg MV Mean grad:  2.0 mmHg    SHUNTS MV Vmax:       0.95 m/s    Systemic VTI:  0.21 m MV Vmean:      62.1 cm/s   Systemic Diam: 2.10 cm MV Decel Time: 264 msec MV E velocity: 71.50 cm/s MV Angelyn Osterberg velocity: 59.85 cm/s MV E/Rhondalyn Clingan ratio:  1.19 Weston Brass MD Electronically signed by Weston Brass MD Signature Date/Time: 01/02/2021/11:17:09 AM    Final    VAS [place] LOWER EXTREMITY VENOUS (DVT)  Result Date: 01/01/2021  Lower Venous DVT Study Patient Name:  Robin Cochran  Date of Exam:   01/01/2021 Medical Rec #: [id number]         Accession #:    [id number] Date of Birth: [birth year]         Patient Gender: F Patient Age:   51 years Exam Location:  [facility] Procedure:      VAS [place] LOWER EXTREMITY VENOUS (DVT) Referring Phys: Tajae Rybicki POWELL JR --------------------------------------------------------------------------------  Indications: Elevated Ddimer.  Risk Factors: COVID 19 positive. Comparison Study: No prior studies. Performing Technologist: Chanda Busing RVT  Examination Guidelines: Shandelle Borrelli complete evaluation includes B-mode imaging, spectral Doppler, color Doppler, and power Doppler as needed of all accessible portions of each vessel. Bilateral testing is considered an integral part of Felesha Moncrieffe complete examination. Limited examinations for reoccurring indications may be performed as noted. The reflux portion of the exam is performed with the  patient in reverse Trendelenburg.  +---------+---------------+---------+-----------+----------+--------------+ RIGHT    CompressibilityPhasicitySpontaneityPropertiesThrombus Aging +---------+---------------+---------+-----------+----------+--------------+ CFV      Full           Yes      Yes                                 +---------+---------------+---------+-----------+----------+--------------+ SFJ      Full                                                        +---------+---------------+---------+-----------+----------+--------------+ FV Prox  Full                                                        +---------+---------------+---------+-----------+----------+--------------+ FV Mid   Full                                                        +---------+---------------+---------+-----------+----------+--------------+  FV DistalFull                                                        +---------+---------------+---------+-----------+----------+--------------+ PFV      Full                                                        +---------+---------------+---------+-----------+----------+--------------+ POP      Full           Yes      Yes                                 +---------+---------------+---------+-----------+----------+--------------+ PTV      Full                                                        +---------+---------------+---------+-----------+----------+--------------+ PERO     Full                                                        +---------+---------------+---------+-----------+----------+--------------+   +---------+---------------+---------+-----------+----------+--------------+ LEFT     CompressibilityPhasicitySpontaneityPropertiesThrombus Aging +---------+---------------+---------+-----------+----------+--------------+ CFV      Full           Yes      Yes                                  +---------+---------------+---------+-----------+----------+--------------+ SFJ      Full                                                        +---------+---------------+---------+-----------+----------+--------------+ FV Prox  Full                                                        +---------+---------------+---------+-----------+----------+--------------+ FV Mid   Full                                                        +---------+---------------+---------+-----------+----------+--------------+ FV DistalFull                                                        +---------+---------------+---------+-----------+----------+--------------+  PFV      Full                                                        +---------+---------------+---------+-----------+----------+--------------+ POP      Full           Yes      Yes                                 +---------+---------------+---------+-----------+----------+--------------+ PTV      Full                                                        +---------+---------------+---------+-----------+----------+--------------+ PERO     Full                                                        +---------+---------------+---------+-----------+----------+--------------+     Summary: RIGHT: - There is no evidence of deep vein thrombosis in the lower extremity.  - No cystic structure found in the popliteal fossa.  LEFT: - There is no evidence of deep vein thrombosis in the lower extremity.  - No cystic structure found in the popliteal fossa.  *See table(s) above for measurements and observations. Electronically signed by Heath Lark on 01/01/2021 at 5:51:50 PM.    Final     Microbiology: Recent Results (from the past 240 hour(s))  Resp Panel by RT-PCR (Flu Leilanni Halvorson&B, Covid) Nasopharyngeal Swab     Status: Abnormal   Collection Time: 12/31/20  6:02 PM   Specimen: Nasopharyngeal Swab; Nasopharyngeal(NP) swabs in vial  transport medium  Result Value Ref Range Status   SARS Coronavirus 2 by RT PCR POSITIVE (Robin Cochran) NEGATIVE Final    Comment: RESULT CALLED TO, READ BACK BY AND VERIFIED WITHHenrene Pastor RN 1943 12/31/20 Robin Cochran (NOTE) SARS-CoV-2 target nucleic acids are DETECTED.  The SARS-CoV-2 RNA is generally detectable in upper respiratory specimens during the acute phase of infection. Positive results are indicative of the presence of the identified virus, but do not rule out bacterial infection or co-infection with other pathogens not detected by the test. Clinical correlation with patient history and other diagnostic information is necessary to determine patient infection status. The expected result is Negative.  Fact Sheet for Patients: BloggerCourse.com  Fact Sheet for Healthcare Providers: SeriousBroker.it  This test is not yet approved or cleared by the [place] FDA and  has been authorized for detection and/or diagnosis of SARS-CoV-2 by FDA under an Emergency Use Authorization (EUA).  This EUA will remain in effect (meaning this test  can be used) for the duration of  the COVID-19 declaration under Section 564(b)(1) of the Act, 21 U.S.C. section 360bbb-3(b)(1), unless the authorization is terminated or revoked sooner.     Influenza Robin Cochran by PCR NEGATIVE NEGATIVE Final   Influenza B by PCR NEGATIVE NEGATIVE Final    Comment: (NOTE) The Xpert Xpress SARS-CoV-2/FLU/RSV plus assay is intended as  an aid in the diagnosis of influenza from Nasopharyngeal swab specimens and should not be used as Robin Cochran sole basis for treatment. Nasal washings and aspirates are unacceptable for Xpert Xpress SARS-CoV-2/FLU/RSV testing.  Fact Sheet for Patients: BloggerCourse.com  Fact Sheet for Healthcare Providers: SeriousBroker.it  This test is not yet approved or cleared by the [place] FDA and has been  authorized for detection and/or diagnosis of SARS-CoV-2 by FDA under an Emergency Use Authorization (EUA). This EUA will remain in effect (meaning this test can be used) for the duration of the COVID-19 declaration under Section 564(b)(1) of the Act, 21 U.S.C. section 360bbb-3(b)(1), unless the authorization is terminated or revoked.  Performed at [facility], [address] [place], [place] [address]   Blood Culture (routine x 2)     Status: None (Preliminary result)   Collection Time: 12/31/20  8:21 PM   Specimen: BLOOD LEFT HAND  Result Value Ref Range Status   Specimen Description BLOOD LEFT HAND  Final   Special Requests   Final    BOTTLES DRAWN AEROBIC AND ANAEROBIC Blood Culture results may not be optimal due to an inadequate volume of blood received in culture bottles   Culture   Final    NO GROWTH 2 DAYS Performed at [facility] [facility], [address], [place], [place] [address]    Report Status PENDING  Incomplete  Blood Culture (routine x 2)     Status: None (Preliminary result)   Collection Time: 12/31/20  8:41 PM   Specimen: BLOOD RIGHT HAND  Result Value Ref Range Status   Specimen Description BLOOD RIGHT HAND  Final   Special Requests   Final    BOTTLES DRAWN AEROBIC AND ANAEROBIC Blood Culture results may not be optimal due to an inadequate volume of blood received in culture bottles   Culture   Final    NO GROWTH 2 DAYS Performed at [facility], [address], [place], [place] [address]    Report Status PENDING  Incomplete     Labs: Basic Metabolic Panel: Recent Labs  Lab 12/31/20 1701 12/31/20 2126 01/01/21 0034 01/02/21 0146  NA 138 136 134* 136  K 3.8 3.5 3.6 4.1  CL 101  --  101 102  CO2 24  --  25 26  GLUCOSE 103*  --  111* 110*  BUN 10  --  11 11  CREATININE 0.96  --  0.78 0.78  CALCIUM 9.0  --  8.9 9.4   Liver Function Tests: Recent Labs  Lab 12/31/20 1701 01/01/21 0034 01/02/21 0146  AST 33 26 21  ALT ALKPHOS 55 48 63  BILITOT 0.7 1.1 0.8  PROT 6.6 6.5 6.3*  ALBUMIN 3.8 3.6 3.5   No results for input(s): LIPASE, AMYLASE in the last 168 hours. No results for input(s): AMMONIA in the last 168 hours. CBC: Recent Labs  Lab 12/31/20 1701 12/31/20 2126 01/01/21 0034 01/02/21 0146  WBC 9.0  --  19.8* 22.5*  NEUTROABS 6.5  --  18.7* 20.5*  HGB 13.7 13.6 13.4 12.3  HCT 42.4 40.0 39.9 35.5*  MCV 93.0  --  89.7 88.1  PLT 292  --  265 262   Cardiac Enzymes: No results for input(s): CKTOTAL, CKMB, CKMBINDEX, TROPONINI in the last 168 hours. BNP: BNP (last 3 results) No results for input(s): BNP in the last 8760 hours.  ProBNP (last 3 results) No results for input(s): PROBNP in the  last 8760 hours.  CBG: Recent Labs  Lab 12/31/20 1712  GLUCAP 102*       Signed:  Lacretia Nicks MD.  [facility] 01/02/2021, 2:26 PM

## 2021-01-02 NOTE — TOC Initial Note (Signed)
Transition of Care [facility]) - Initial/Assessment Note    Patient Details  Name: Robin Cochran MRN: [id number] Date of Birth: 23-Oct-1985  Transition of [facility]) CM/SW Contact:    Lockie Pares, RN Phone Number: 01/02/2021, 9:27 AM  Clinical Narrative:                  35 YO patient found by son to be unresponsive at home, urine positive for THC, no other. Positive COVID, pneumonia, on 2LPM. Patient is uninsured, no PCP, PCP recommended, [facility]. Appointment made for post hospitalization and placed on patient instructions. Patient may need MATCH for medications. CM will follow for needs.  Expected Discharge Plan: Home/Self Care Barriers to Discharge: Inadequate or no insurance   Patient Goals and CMS Choice        Expected Discharge Plan and Services Expected Discharge Plan: Home/Self Care     Post Acute Care Choice: NA Living arrangements for the past 2 months: Apartment                                      Prior Living Arrangements/Services Living arrangements for the past 2 months: Apartment Lives with:: Minor Children Patient language and need for interpreter reviewed:: Yes        Need for Family Participation in Patient Care: Yes (Comment) Care giver support system in place?: Yes (comment)   Criminal Activity/Legal Involvement Pertinent to Current Situation/Hospitalization: No - Comment as needed  Activities of Daily Living Home Assistive Devices/Equipment: None ADL Screening (condition at time of admission) Patient's cognitive ability adequate to safely complete daily activities?: Yes Is the patient deaf or have difficulty hearing?: No Does the patient have difficulty seeing, even when wearing glasses/contacts?: No Does the patient have difficulty concentrating, remembering, or making decisions?: No Patient able to express need for assistance with ADLs?: Yes Does the patient have difficulty dressing or bathing?: No Independently performs  ADLs?: Yes (appropriate for developmental age) Does the patient have difficulty walking or climbing stairs?: No Weakness of Legs: None Weakness of Arms/Hands: None  Permission Sought/Granted                  Emotional Assessment       Orientation: : Oriented to Self, Oriented to Place, Oriented to  Time, Oriented to Situation Alcohol / Substance Use: Not Applicable Psych Involvement: No (comment)  Admission diagnosis:  Acute respiratory failure with hypoxia (HCC) [J96.01] Acute respiratory failure due to COVID-19 (HCC) [U07.1, J96.00] Pneumonia due to COVID-19 virus [U07.1, J12.82] Patient Active Problem List   Diagnosis Date Noted   Acute respiratory failure due to COVID-19 (HCC) 12/31/2020   Multifocal pneumonia 12/31/2020   Mild preeclampsia 04/28/2018   Postpartum care following vaginal delivery 04/21/2018   Abnormal antibody  12/21/2017   Encounter for supervision of normal pregnancy, unspecified, unspecified trimester 12/07/2017   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 [place] [address] Phone: [phone] Fax: [phone]     Social Determinants of Health (SDOH) Interventions    Readmission Risk Interventions No flowsheet data found.

## 2021-01-02 NOTE — Progress Notes (Signed)
Notified md that patient is requesting to discharge:The oxygen has been removed, the patient has removed her tele and is requesting for the iv to be removed. she is stating she has no one to watch her children, I educated her on her covid status. She is asking if she leaves can she have medication for her covid. Notified primary nurse and md.

## 2021-01-02 NOTE — Progress Notes (Signed)
*  PRELIMINARY RESULTS* Echocardiogram 2D Echocardiogram has been performed.  Neomia Dear RDCS 01/02/2021, 10:03 AM

## 2021-01-02 NOTE — Progress Notes (Signed)
SATURATION QUALIFICATIONS: (This note is used to comply with regulatory documentation for home oxygen)  Patient Saturations on Room Air at Rest = 99%  Patient Saturations on Room Air while Ambulating = 91%  Please briefly explain why patient needs home oxygen: N/A 

## 2021-01-02 NOTE — Progress Notes (Signed)
Attempted to reach patient via numbers in chart, numbers are not in a working status.

## 2021-01-05 LAB — CULTURE, BLOOD (ROUTINE X 2)
Culture: NO GROWTH
Culture: NO GROWTH

## 2021-01-07 LAB — URINE DRUGS OF ABUSE SCREEN W ALC, ROUTINE (REF LAB)
Amphetamines, Urine: NEGATIVE ng/mL
Barbiturate, Ur: NEGATIVE ng/mL
Benzodiazepine Quant, Ur: NEGATIVE ng/mL
Cocaine (Metab.): NEGATIVE ng/mL
Ethanol U, Quan: NEGATIVE %
Methadone Screen, Urine: NEGATIVE ng/mL
Opiate Quant, Ur: NEGATIVE ng/mL
Phencyclidine, Ur: NEGATIVE ng/mL
Propoxyphene, Urine: NEGATIVE ng/mL

## 2021-01-07 LAB — PANEL 799049
CARBOXY THC GC/MS CONF: >750 ng/mL
Cannabinoid GC/MS, Ur: POSITIVE — AB

## 2021-03-01 NOTE — Progress Notes (Signed)
Erroneous encounter

## 2021-03-04 ENCOUNTER — Encounter: Payer: Medicaid Other | Admitting: Family

## 2021-03-04 DIAGNOSIS — Z7689 Persons encountering health services in other specified circumstances: Secondary | ICD-10-CM

## 2021-10-11 IMAGING — DX DG CHEST 1V PORT
1 series · 1 of 1 positions shown · non-contrast
Comparison: 07/22/2020

CLINICAL DATA: Shortness of breath.  Found unresponsive by family.

EXAM:
PORTABLE CHEST 1 VIEW

[chest]
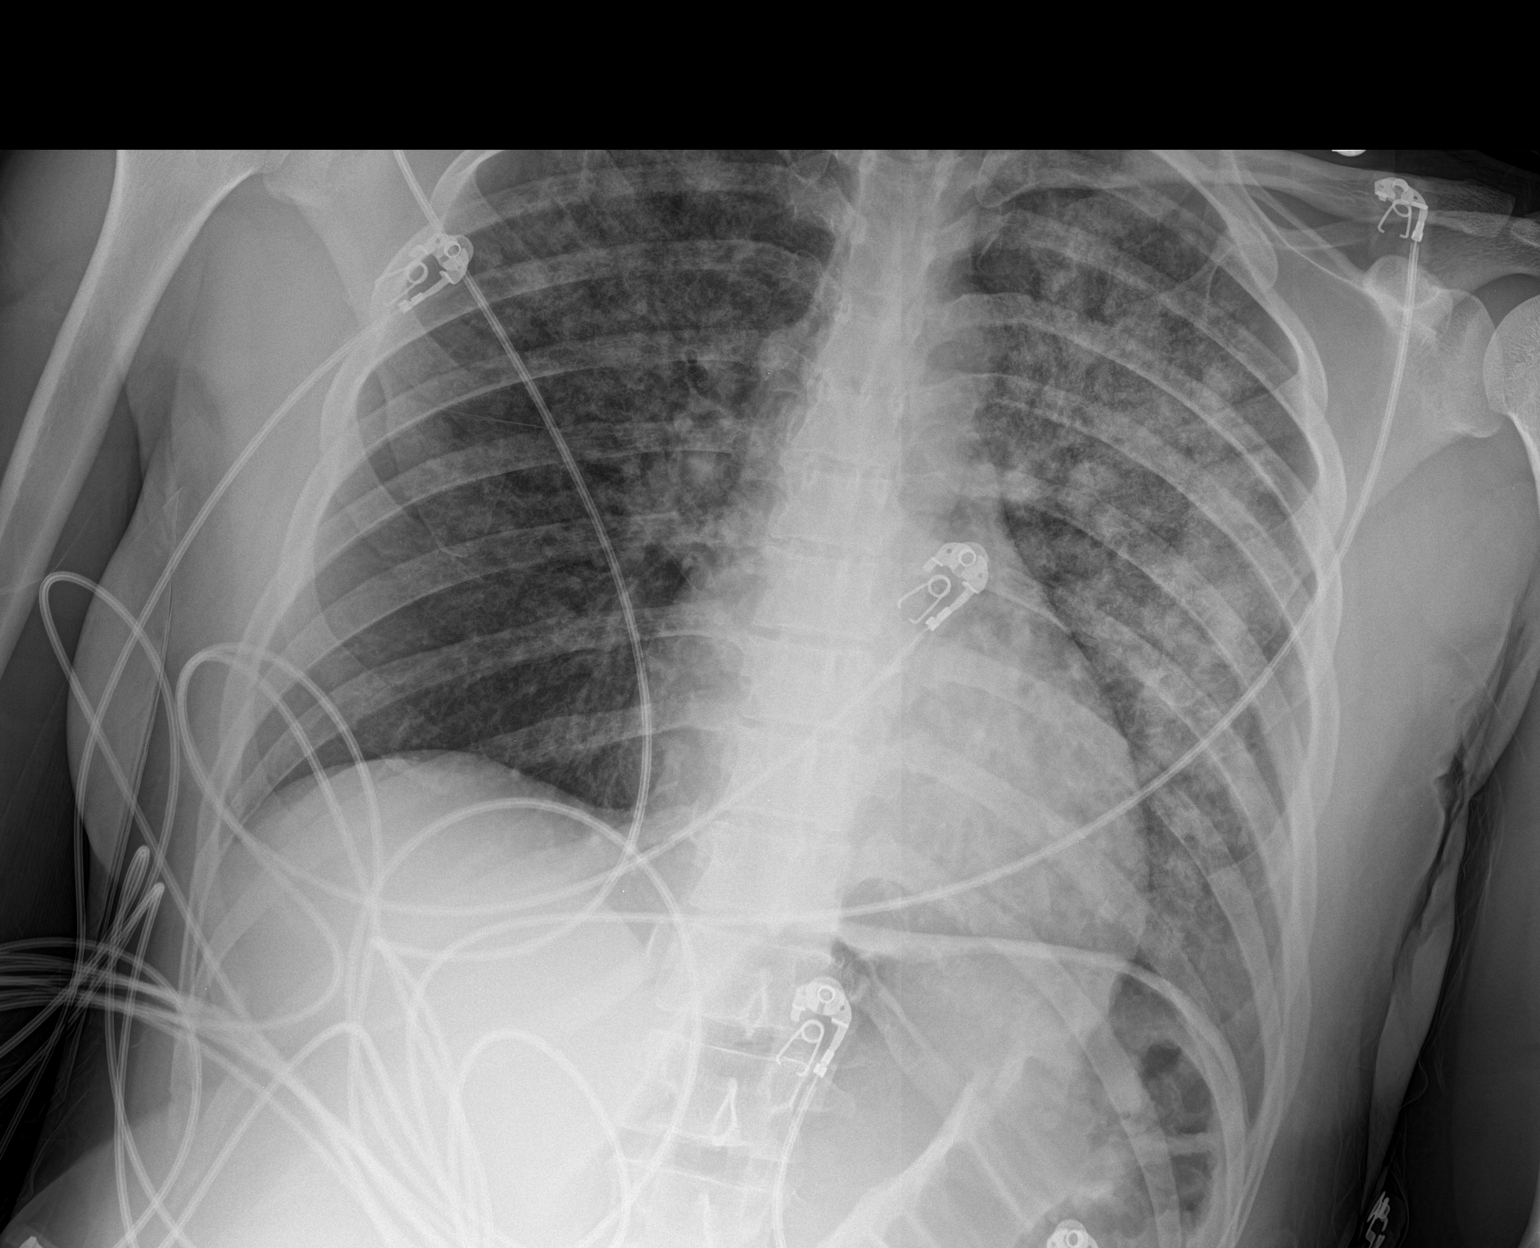

[1 of 1 positions shown; findings below may reference images not displayed]

FINDINGS: Heart size and mediastinal contours are normal. No signs of pleural
effusion. There are diffuse airspace opacities identified throughout
the left lung and right upper lobe. Visualized osseous structures
are unremarkable.
IMPRESSION: Bilateral airspace opacities compatible with multifocal infection.

## 2021-10-13 IMAGING — DX DG CHEST 1V PORT
1 series · 1 of 1 positions shown · non-contrast
Comparison: December 31, 2020.

CLINICAL DATA: Chest pain.  [id number] positive.

EXAM:
PORTABLE CHEST 1 VIEW

[chest ap]
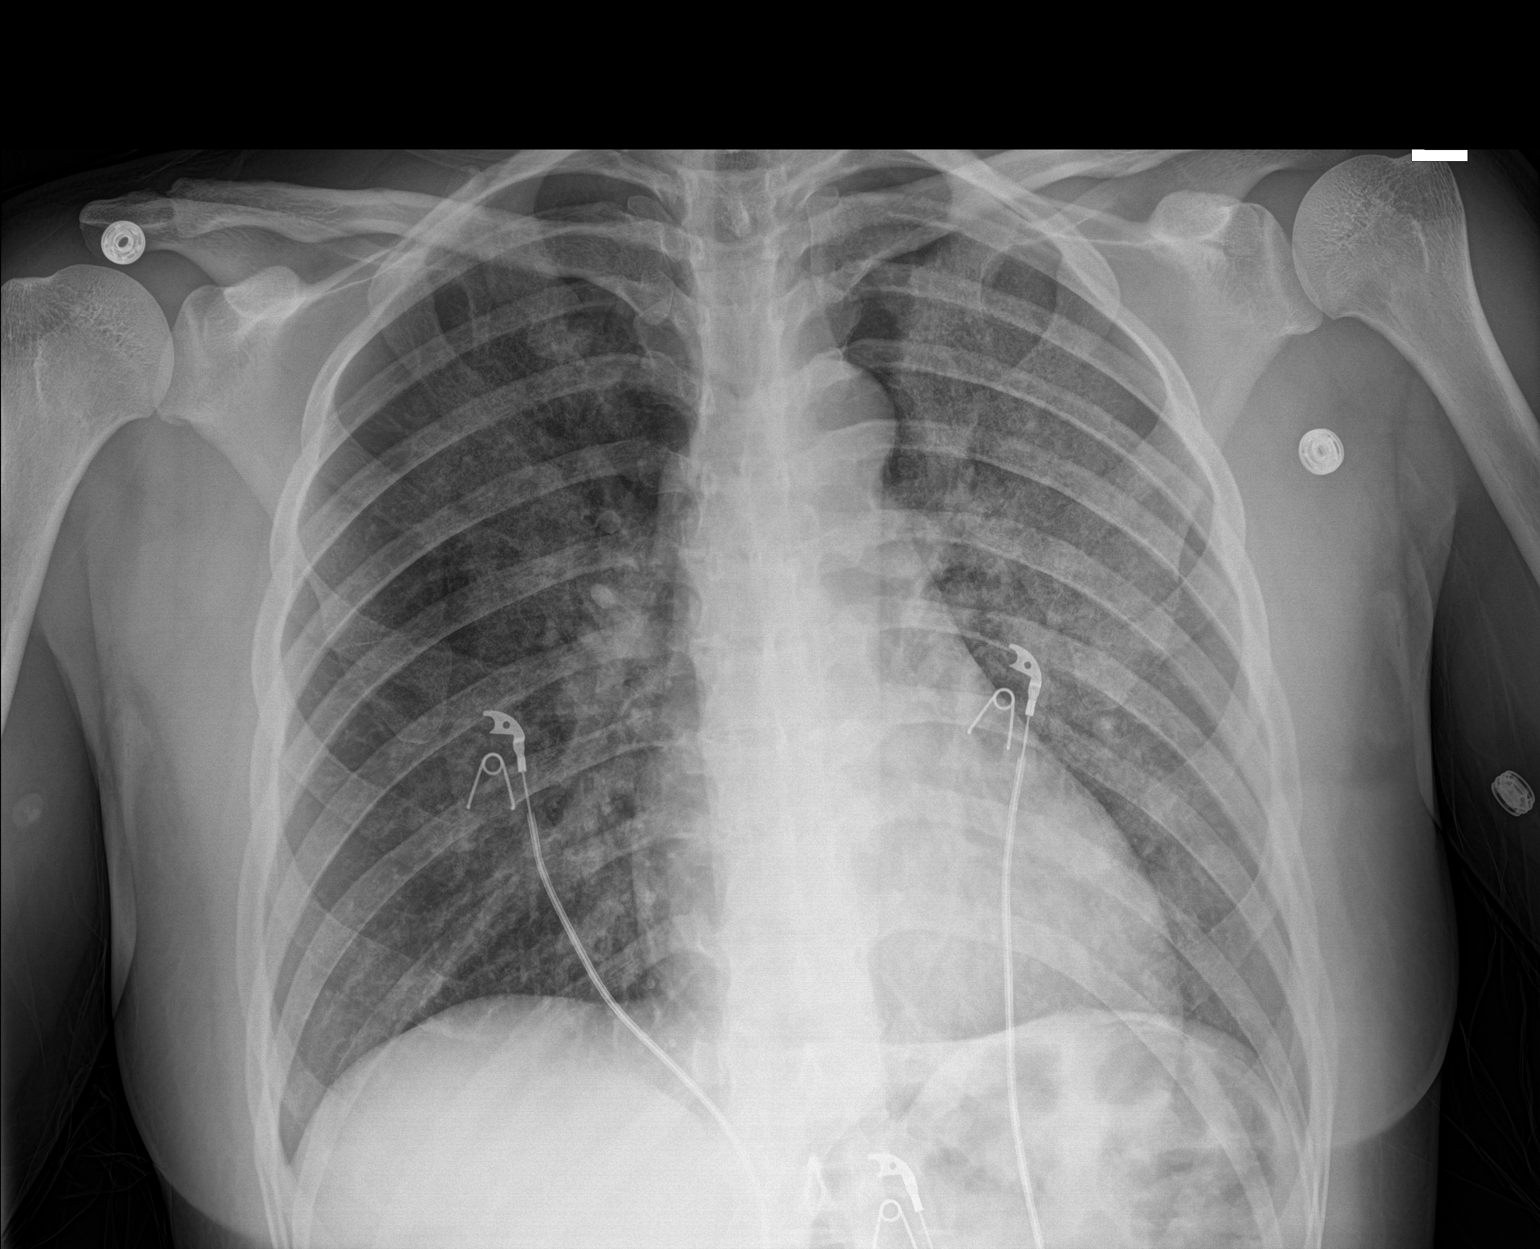

[1 of 1 positions shown; findings below may reference images not displayed]

FINDINGS: The heart size and mediastinal contours are within normal limits.
Bilateral lung opacities are noted consistent with multifocal
pneumonia. The visualized skeletal structures are unremarkable.
IMPRESSION: Stable bilateral lung opacities are noted consistent with multifocal
pneumonia.
